# Patient Record
Sex: Female | Born: 1965 | Race: White | Hispanic: No | Marital: Single | Smoking: Former smoker
Health system: Southern US, Community
[De-identification: ages and names within clinical notes are randomized; demographics above are authoritative.]

## PROBLEM LIST (undated history)

## (undated) DIAGNOSIS — F419 Anxiety disorder, unspecified: Secondary | ICD-10-CM

## (undated) DIAGNOSIS — T8131XA Disruption of external operation (surgical) wound, not elsewhere classified, initial encounter: Secondary | ICD-10-CM

## (undated) DIAGNOSIS — T81328A Disruption or dehiscence of closure of other specified internal operation (surgical) wound, initial encounter: Secondary | ICD-10-CM

## (undated) DIAGNOSIS — C539 Malignant neoplasm of cervix uteri, unspecified: Secondary | ICD-10-CM

## (undated) HISTORY — DX: Anxiety disorder, unspecified: F41.9

## (undated) HISTORY — DX: Disruption of external operation (surgical) wound, not elsewhere classified, initial encounter: T81.31XA

## (undated) HISTORY — PX: REPAIR VAGINAL CUFF: SHX6067

## (undated) HISTORY — PX: ABDOMINAL HYSTERECTOMY: SHX81

## (undated) HISTORY — DX: Disruption or dehiscence of closure of other specified internal operation (surgical) wound, initial encounter: T81.328A

## (undated) HISTORY — DX: Malignant neoplasm of cervix uteri, unspecified: C53.9

---

## 2014-11-09 ENCOUNTER — Encounter: Payer: Self-pay | Admitting: *Deleted

## 2014-11-09 ENCOUNTER — Emergency Department
Admission: EM | Admit: 2014-11-09 | Discharge: 2014-11-09 | Disposition: A | Discharge status: Home / Self Care | Payer: 59 | Attending: Emergency Medicine | Admitting: Emergency Medicine

## 2014-11-09 ENCOUNTER — Emergency Department: Payer: 59

## 2014-11-09 DIAGNOSIS — Z720 Tobacco use: Secondary | ICD-10-CM | POA: Diagnosis not present

## 2014-11-09 DIAGNOSIS — N888 Other specified noninflammatory disorders of cervix uteri: Secondary | ICD-10-CM | POA: Diagnosis not present

## 2014-11-09 DIAGNOSIS — N939 Abnormal uterine and vaginal bleeding, unspecified: Secondary | ICD-10-CM | POA: Insufficient documentation

## 2014-11-09 LAB — CBC WITH DIFFERENTIAL/PLATELET
Basophils Absolute: 0.1 10*3/uL (ref 0–0.1)
Basophils Relative: 1 %
EOS ABS: 0.1 10*3/uL (ref 0–0.7)
EOS PCT: 1 %
HCT: 42.4 % (ref 35.0–47.0)
Hemoglobin: 14.1 g/dL (ref 12.0–16.0)
Lymphocytes Relative: 18 %
Lymphs Abs: 1.9 10*3/uL (ref 1.0–3.6)
MCH: 30.8 pg (ref 26.0–34.0)
MCHC: 33.4 g/dL (ref 32.0–36.0)
MCV: 92.4 fL (ref 80.0–100.0)
Monocytes Absolute: 0.7 10*3/uL (ref 0.2–0.9)
Monocytes Relative: 6 %
Neutro Abs: 8.1 10*3/uL — ABNORMAL HIGH (ref 1.4–6.5)
Neutrophils Relative %: 74 %
Platelets: 290 10*3/uL (ref 150–440)
RBC: 4.59 MIL/uL (ref 3.80–5.20)
RDW: 13.2 % (ref 11.5–14.5)
WBC: 11 10*3/uL (ref 3.6–11.0)

## 2014-11-09 LAB — BASIC METABOLIC PANEL
Anion gap: 9 (ref 5–15)
BUN: 29 mg/dL — ABNORMAL HIGH (ref 6–20)
CALCIUM: 10.2 mg/dL (ref 8.9–10.3)
CO2: 28 mmol/L (ref 22–32)
Chloride: 104 mmol/L (ref 101–111)
Creatinine, Ser: 0.84 mg/dL (ref 0.44–1.00)
GLUCOSE: 161 mg/dL — AB (ref 65–99)
Potassium: 3.7 mmol/L (ref 3.5–5.1)
SODIUM: 141 mmol/L (ref 135–145)

## 2014-11-09 LAB — WET PREP, GENITAL
Clue Cells Wet Prep HPF POC: NONE SEEN
TRICH WET PREP: NONE SEEN
Yeast Wet Prep HPF POC: NONE SEEN

## 2014-11-09 LAB — CHLAMYDIA/NGC RT PCR (ARMC ONLY)
Chlamydia Tr: NOT DETECTED
N gonorrhoeae: NOT DETECTED

## 2014-11-09 MED ORDER — LORAZEPAM 1 MG PO TABS
1.0000 mg | ORAL_TABLET | Freq: Once | ORAL | Status: AC
  Administered 2014-11-09: 1 mg via ORAL

## 2014-11-09 MED ORDER — LORAZEPAM 1 MG PO TABS
ORAL_TABLET | ORAL | Status: AC
  Filled 2014-11-09: qty 1

## 2014-11-09 NOTE — ED Notes (Addendum)
Pt has vag bleeding for several weeks. No abd pain.  No dysuria.  No back pain.  Seen at the urgent care today and sent to er for eval.

## 2014-11-09 NOTE — ED Provider Notes (Signed)
[facility] Emergency Department Provider Note  ____________________________________________  Time seen: Approximately 5:30PM  I have reviewed the triage vital signs and the nursing notes.   HISTORY  Chief Complaint Vaginal Bleeding    HPI Deanna Haynes is a 49 y.o. female without any medical history who presents today with several weeks of vaginal bleeding. She says that she feels like there is a sore inside her vagina that is been bleeding intermittently. She says that after she wipes she has a small amount of blood and then the bleeding stops. She says that after several weeks she wanted to get it evaluated so went to urgent care today and was sent directly to the emergency department because of postmenopausal vaginal bleeding. She said that she has not had a stroke. In 10 years. She also denies any history of sexually transmitted diseases. Says is not currently sexually active.Denies any pain associated with the lesion. Patient does not have an OB/GYN and possible last Pap smear in her teens.   No past medical history on file.  There are no active problems to display for this patient.   No past surgical history on file.  No current outpatient prescriptions on file.  Allergies Codeine  No family history on file.  Social History History  Substance Use Topics  . Smoking status: Current Every Day Smoker  . Smokeless tobacco: Not on file  . Alcohol Use: No    Review of Systems Constitutional: No fever/chills Eyes: No visual changes. ENT: No sore throat. Cardiovascular: Denies chest pain. Respiratory: Denies shortness of breath. Gastrointestinal: No abdominal pain.  No nausea, no vomiting.  No diarrhea.  No constipation. Genitourinary: Negative for dysuria. Musculoskeletal: Negative for back pain. Skin: Negative for rash. Neurological: Negative for headaches, focal weakness or numbness.  10-point ROS otherwise  negative.  ____________________________________________   PHYSICAL EXAM:  VITAL SIGNS: ED Triage Vitals  Enc Vitals Group     BP 11/09/14 1642 151/92 mmHg     Pulse Rate 11/09/14 1642 84     Resp 11/09/14 1642 20     Temp 11/09/14 1642 98.1 F (36.7 C)     Temp Source 11/09/14 1642 Oral     SpO2 11/09/14 1642 99 %     Weight 11/09/14 1642 130 lb (58.968 kg)     Height 11/09/14 1642 5\' 5"  (1.651 m)     Head Cir --      Peak Flow --      Pain Score --      Pain Loc --      Pain Edu? --      Excl. in [place]? --     Constitutional: Alert and oriented. Well appearing and in no acute distress. Eyes: Conjunctivae are normal. PERRL. EOMI. Head: Atraumatic. Nose: No congestion/rhinnorhea. Mouth/Throat: Mucous membranes are moist.  Oropharynx non-erythematous. Neck: No stridor.   Cardiovascular: Normal rate, regular rhythm. Grossly normal heart sounds.  Good peripheral circulation. Respiratory: Normal respiratory effort.  No retractions. Lungs CTAB. Gastrointestinal: Soft and nontender. No distention. No abdominal bruits. No CVA tenderness. Genitourinary: Normal external exam. Speculum exam with bright red blood present in the vault. No pooling of blood. Cervix has a irregular and wartlike appearance with mild oozing of blood. No CMT on bimanual exam. No uterine or adnexal masses on bimanual exam. Musculoskeletal: No lower extremity tenderness nor edema.  No joint effusions. Neurologic:  Normal speech and language. No gross focal neurologic deficits are appreciated. No gait instability. Skin:  Skin is  warm, dry and intact. No rash noted. Psychiatric: Mood and affect are normal. Speech and behavior are normal.  ____________________________________________   LABS (all labs ordered are listed, but only abnormal results are displayed)  Labs Reviewed  WET PREP, GENITAL - Abnormal; Notable for the following:    WBC, Wet Prep HPF POC FEW (*)    All other components within normal limits   CBC WITH DIFFERENTIAL/PLATELET - Abnormal; Notable for the following:    Neutro Abs 8.1 (*)    All other components within normal limits  BASIC METABOLIC PANEL - Abnormal; Notable for the following:    Glucose, Bld 161 (*)    BUN 29 (*)    All other components within normal limits  CHLAMYDIA/NGC RT PCR ([facility] ONLY)   ____________________________________________  EKG   ____________________________________________  RADIOLOGY  4 cm cervical mass noted on ultrasound. ____________________________________________   PROCEDURES    ____________________________________________   INITIAL IMPRESSION / ASSESSMENT AND PLAN / ED COURSE  Pertinent labs & imaging results that were available during my care of the patient were reviewed by me and considered in my medical decision making (see chart for details).  Discussed the case with Dr. Kenton Kingfisher from [facility] side OB/GYN. Patient had requested [place] side for follow-up. Says to call the morning to schedule an urgent ER follow-up.  ----------------------------------------- 8:12 PM on 11/09/2014 -----------------------------------------  Patient friends updated about findings on the ultrasound. Patient became tearful and noticeably upset. Given by mouth Ativan and now more calm. Patient to follow-up with [facility] side OB/GYN. To call tomorrow for urgent follow-up. Patient denies any suicidal ideation. Will be with friends overnight tonight and will be able to keep an eye on her and make sure that she is calm. Cervical mass is source of bleeding. ____________________________________________   FINAL CLINICAL IMPRESSION(S) / ED DIAGNOSES  Acute cervical mass. Initial visit.    Orbie Pyo, MD 11/09/14 2017

## 2014-11-09 NOTE — ED Notes (Signed)
Pt arrives with complaints of vaginal bleeding, pt states she has been through menopause but for the past few weeks has had vaginal bleeding increasing in frequency, pt states "it feels like there is a sore down there"

## 2014-11-12 ENCOUNTER — Other Ambulatory Visit: Payer: Self-pay | Admitting: Obstetrics and Gynecology

## 2014-11-12 DIAGNOSIS — C539 Malignant neoplasm of cervix uteri, unspecified: Secondary | ICD-10-CM

## 2014-11-15 ENCOUNTER — Ambulatory Visit
Admission: RE | Admit: 2014-11-15 | Discharge: 2014-11-15 | Disposition: A | Discharge status: Home / Self Care | Payer: 59 | Source: Ambulatory Visit | Attending: Obstetrics and Gynecology | Admitting: Obstetrics and Gynecology

## 2014-11-15 DIAGNOSIS — E041 Nontoxic single thyroid nodule: Secondary | ICD-10-CM | POA: Diagnosis not present

## 2014-11-15 DIAGNOSIS — C539 Malignant neoplasm of cervix uteri, unspecified: Secondary | ICD-10-CM | POA: Diagnosis not present

## 2014-11-15 LAB — GLUCOSE, CAPILLARY: Glucose-Capillary: 94 mg/dL (ref 65–99)

## 2014-11-15 MED ORDER — FLUDEOXYGLUCOSE F - 18 (FDG) INJECTION
12.6700 | Freq: Once | INTRAVENOUS | Status: DC | PRN
  Administered 2014-11-15: 12.67 via INTRAVENOUS
  Filled 2014-11-15: qty 12.67

## 2014-11-16 ENCOUNTER — Ambulatory Visit: Payer: Commercial Managed Care - PPO

## 2014-11-17 ENCOUNTER — Inpatient Hospital Stay: Payer: Commercial Managed Care - PPO | Attending: Obstetrics and Gynecology | Admitting: Obstetrics and Gynecology

## 2014-11-17 VITALS — BP 130/81 | HR 81 | Temp 99.1°F | Resp 20 | Ht 65.0 in | Wt 138.2 lb

## 2014-11-17 DIAGNOSIS — C539 Malignant neoplasm of cervix uteri, unspecified: Secondary | ICD-10-CM

## 2014-11-17 NOTE — Patient Instructions (Signed)
Cervical Cancer The cervix is the opening and bottom part of the uterus between the vagina and the uterus. Cervical cancer is a fairly common cancer. It occurs most often in women between the ages of 40 years and 55 years. Cells of the cervix act very much like skin cells. These cells are exposed to toxins, viruses, and bacteria that may cause abnormal changes.  There are two kinds of cancers of the cervix:   Squamous cell carcinoma. This type of cancer starts in the flat or scale-like cells that line the cervix. Squamous cell carcinoma can develop from a sexually transmitted infection caused by the human papillomavirus (HPV).  Adenocarcinoma. This type of cervical cancer starts in glandular cells that line the cervix. RISK FACTORS The risk of getting cancer of the cervix is related to your lifestyle, sexual history, health, and immune system. Risks for cervical cancer include:   Having a sexually transmitted viral infection. These include:  Chlamydia.   Herpes.   HPV.  Becoming sexually active before age 18 years.  Having more than one sexual partner or having sex with someone who has more than one sexual partner.  Not using condoms with sexual partners.  Having had cancer of the vagina or vulva.  Having a sexual partner who has or had cancer of the penis or who has had a sexual partner with abnormal cervical cells (dysplasia) or cervical cancer.  Using oral contraceptives (also called birth control pills).  Smoking.   Having a weakened immune system. For example, human immunodeficiency virus (HIV) or other immune deficiency disorders.  Being the daughter of a woman who took diethylstilbestrol (DES) during pregnancy.  Having a sister or mother who has had cancer of the cervix.  Being African American, Hispanic, Asian, or a woman from the [place].  A history of dysplasia of the cervix. SIGNS AND SYMPTOMS  Symptoms are usually not present in the early stages of  cervical cancer. Once the cancer invades the cervix and surrounding tissues, the woman may have:   Abnormal vaginal bleeding or menstrual bleeding that is longer or heavier than usual.  Bleeding after intercourse, douching, or a Pap test.  Vaginal bleeding following menopause.  Abnormal vaginal discharge.  Pelvic discomfort or pain.  An abnormal Pap test.  Pain during sexual intercourse. Symptoms of more advanced cervical cancer may include:   Loss of appetite or weight loss.  Tiredness (fatigue).  Back and leg pain.  Inability to control urination or bowel movements. DIAGNOSIS  A pelvic exam and Pap test are done to diagnose the condition. If abnormalities are found during the exam or Pap test, the Pap test may be repeated in 3 months, or your health care provider may do additional tests or procedures, such as:   A colposcopy. This is a procedure that uses a special microscope that allows the health care provider to magnify and closely examine the cells of the cervix, vagina, and vulva.  Cervical biopsies. This is a procedure where small tissue samples are taken from the cervix to be examined under a microscope by a specialist.   A cone biopsy. This is a procedure to test for or remove cancerous tissue. Other tests may be needed, including:   Cystoscopy.   Proctoscopy or sigmoidoscopy.  Ultrasound.   CT scan.   MRI.   Laparoscopy.  There are different stages of cervical cancer:   Stage 0, carcinoma in situ (CIS)--This first stage of cancer is the last and most serious stage of   dysplasia.  Stage I--This means the tumor is in the uterus and cervix only.  Stage II--This means the tumor has spread to the upper vagina. The cancer has spread beyond the uterus but not to the pelvic walls or lower third of the vagina.  Stage III--This means the tumor has invaded the side wall of the pelvis and the lower third of the vagina. If the tumor blocks the tubes that  carry urine to the bladder (ureters), it may cause urine to back up and the kidneys to swell (hydronephrosis).  Stage IV--This means the tumor has spread to the rectum or bladder. In the later part of this stage, it has also spread to distant organs, like the lungs. TREATMENT  Treatment options can include:   Cone biopsy to remove the cancerous tissue.   Removal of the entire uterus and cervix.   Removal of the uterus, cervix, upper vagina, lymph nodes, and surrounding tissue (modified radical hysterectomy). The ovaries may be left in place or removed.  Medicines to treat cancer.   A combination of surgery, radiation, and chemotherapy.   Biological response modifiers. These are substances that help strengthen your immune system's fight against cancer or infection. They may be used in combination with chemotherapy. HOME CARE INSTRUCTIONS   Get a gynecology exam and Pap test once every year or as directed by your health care provider.   Get the HPV vaccine.   Do not smoke.  Do not have sexual intercourse until your health care provider says it is okay.  Use a condom every time you have sex. SEEK MEDICAL CARE IF:   You have increased pelvic pain or pressure.   Your are becoming increasingly tired.   You have increased leg or back pain.   You have a fever.  You have abnormal bleeding or discharge.  You lose weight. SEEK IMMEDIATE MEDICAL CARE IF:   You cannot urinate.  You have blood in your urine.   You have blood or pressure with a bowel movement.   You develop severe back, stomach, or pelvic pain. Document Released: 03/19/2005 Document Revised: 03/24/2013 Document Reviewed: 09/10/2012 [facility] Patient Information 2015 [place], [place]. This information is not intended to replace advice given to you by your health care provider. Make sure you discuss any questions you have with your health care provider.  Risks of surgery. These include infection,  anesthesia, bleeding, transfusion, wound separation, medical issues (blood clots, stroke, heart attack, fluid in the lungs, pneumonia, abnormal heart rhythm, death), injury to nearby organs (bladder, bowel, ureter, blood vessels, nerve), possible exploratory surgery with larger incision, lymphedema (leg swelling), lymphocyst, allergic reaction, bladder dysfunction (possible need for intermittent catheter or permanent catheter), sexual issues due to shortened vagina, persistent scar tissue and pain.   Follow up appt at [facility] 11/25/2014.  Surgery 11/26/2014 robotic radical hysterectomy (removal of uterus, cervix, tubes, and ovaries), sentinel lymph node injection and node biopsy with pelvic and para-aortic lymph node dissection.
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
## 2014-11-23 ENCOUNTER — Telehealth: Payer: Self-pay | Admitting: *Deleted

## 2014-11-23 NOTE — Telephone Encounter (Signed)
Ms. Deanna Haynes dob 10-14-1965 states that she is overwhelmed and emotional over her diagnosis.  She states that Dr. Leafy Ro started her on Xanax 1 mg at bedtime.  She wants something stronger for her anxiety. Pt requesting to talk to someone about her feelings. Pt states "I am so fearful of being put to sleep and the nurses and doctors waking me up telling me that my cancer is not curable and they could not take the all the cancer away." Theureputic listening performed with patient and reassurance provided.  Msg sent to Dr. Theora Gianotti for md recommendations.  Pt had questions about preop. Is this a phone interview only for Thursday. Patient wants to clarify.  Pt also requesting disability papers to be completed and a note written asap for work that she is having the surgery performed on Friday by Dr. Theora Gianotti at [facility]. The note needs to state an estimated return to work date after the surgery.  RN spoke with Carl Best at [facility]. The patient can bring her disability paperwork to this appointment and she can receive a note at that time for her surgery. Per Baldo Ash, the patienet needs to keep her appointments at [facility] as scheduled. Pt states that she doesn't have any appointments at [facility] and does not know the time of these appointments.  I asked Baldo Ash to call the patient directly to clarify the patient's appointment time.  I also provided the patient with the number to call at [facility] for further pre-op/postoperative questions or concerns.  Nurse Triage Line Mon-Fri 8am to 5pm [phone]; [place] Mon-Fri 8am to 5pm [phone]; For appointments call the scheduling Hub [phone].  This information was also relayed to the patient's friend Deanna Haynes, who was at the residence per the patient's request. Patient became tearful when discussing her care. She asked her friend Deanna Haynes to take down the phone numbers and information to contact [facility].

## 2014-11-23 NOTE — Telephone Encounter (Signed)
I spoke with Toma Copier, coordinator for clinical social worker, who is covering for Judson Roch in her absence.  Baxter Flattery will have one of her colleagues reach out to the patient. Hand off of care performed with CSW.  Dr. Theora Gianotti will coordinate a clinical psychology team at [facility] to reach out to the patient.

## 2014-11-23 NOTE — Telephone Encounter (Signed)
Spoke with Dr. Theora Gianotti. No further medication recommendations at this time for patient's anxiety.  Md states, "Do you all have psychologist that can reach out and contact her. We have a psychology team at [facility]. If you don't, I can get her connected on Friday."  Until a referral can be initiated, I can reach out to Darden Dates at [facility] clinical social worker as patient is now requesting to talk to someone about her feelings.

## 2014-11-24 DIAGNOSIS — C539 Malignant neoplasm of cervix uteri, unspecified: Secondary | ICD-10-CM | POA: Insufficient documentation

## 2014-11-25 ENCOUNTER — Ambulatory Visit: Payer: Self-pay | Admitting: Family Medicine

## 2014-11-25 ENCOUNTER — Encounter: Payer: Self-pay | Admitting: *Deleted

## 2014-11-25 DIAGNOSIS — E079 Disorder of thyroid, unspecified: Secondary | ICD-10-CM | POA: Insufficient documentation

## 2014-11-25 NOTE — Progress Notes (Signed)
[place] Clinical Social Work  Clinical Social Work was referred by Renita Papa, RN, for assessment of psychosocial needs. RN reported patient expressed anxiety surrounding upcoming surgery.  Clinical Social Worker contacted patient by phone to offer support and assess for needs.  Ms. Mannina shared she is "scared" to undergo surgery, specifically to be put under anesthesia.  She reported she has "never been sick like this before".  CSW validated patient's feelings related to fear of the unknown and provided brief emotional support.  The patient feels her childhood history of being fearful of medical procedures may also be contributing to her anxiety.  CSW and patient discussed short-term plan to cope with anxiety: patient plans to stay with her best friend this evening and take anti-anxiety medication as prescribed.  CSW strongly recommended patient utilize psychology services available to patient while inpatient at [facility].    CSW will follow up with patient after surgery- Patient agreed she could benefit from psychotherapy, but was hesitant to travel to [facility] often.  Referral to local counseling option in [place] may be suitable outpatient option following surgery.  CSW also provided information on [facility] [place].      Polo Riley, MSW, LCSW, OSW-C Clinical Social Worker [facility] [phone]

## 2014-11-26 HISTORY — PX: RADICAL HYSTERECTOMY: SHX2283

## 2014-11-30 ENCOUNTER — Telehealth: Payer: Self-pay | Admitting: *Deleted

## 2014-11-30 DIAGNOSIS — Z960 Presence of urogenital implants: Principal | ICD-10-CM

## 2014-11-30 DIAGNOSIS — C539 Malignant neoplasm of cervix uteri, unspecified: Secondary | ICD-10-CM

## 2014-11-30 DIAGNOSIS — Z978 Presence of other specified devices: Secondary | ICD-10-CM

## 2014-11-30 NOTE — Telephone Encounter (Signed)
Needs appointment for void per Dr. Theora Gianotti. Spoke with patient. She will come on Friday morning at 830am for this the void trial.

## 2014-12-01 ENCOUNTER — Telehealth: Payer: Self-pay | Admitting: *Deleted

## 2014-12-01 NOTE — Telephone Encounter (Signed)
Has had insomnia for a whikle and was ordered Xanax by Dr Leafy Ro, Before surgery, she states Dr Theora Gianotti told her she could take an extra 1/2 tab if needed, now she is out of Xanax and cannot get refill before 9/7. She states she cannot sleep at night and needs something. Reports she is not having any pain and is not using Oxycodone or even Ibuprofen because she has no pain, but needs something to help her sleep. She states she is unable to get up and ambulate at night like she normally does. Asking Dr Theora Gianotti for med and has not contacted Dr Leafy Ro regarding this

## 2014-12-02 ENCOUNTER — Encounter: Payer: Self-pay | Admitting: *Deleted

## 2014-12-02 NOTE — Telephone Encounter (Signed)
Patient called back and was in tears stating that she needs some help, she is unable to get up and around and has no family and needs someone to speak with. I contacted Toma Copier MSW for the patient who will contact Loren the SW who saw the patient prior to surgery and they will call patient ASAP

## 2014-12-02 NOTE — Progress Notes (Signed)
[place] [facility]  Clinical Social Work was referred by [facility] triage RN for assessment of psychosocial needs- CSW is familiar with patient from contact prior to surgery.  Clinical Social Worker contacted patient by phone to offer support and assess for needs.  Deanna Haynes shared her surgery "went great" and she is recovering well.  She reported her anxiety and difficulty sleeping has not resolved and she is having difficulty coping with her anxiety.  Patient received coping strategies and brief emotional counseling from counselor at [facility] while inpatient.  CSW dicussed need for counseling support locally- referred patient to [facility] in [place], [place].  CSW also recommended patient obtain primary care physician or psychiatrist to manage anxiety medications long term.  Patient has requested refill on anxiety medication and medication to address difficulty sleeping.   CSW discussed case with Jarrett Soho, triage RN.  RN communicated she has put in request to MD and is awaiting response. CSW called patient and notified patient they are awaiting response from MD.  Patient shared she goes to [facility] tomorrow for follow up visit and can follow up on this request with [facility] directly.   Deanna Haynes, MSW, LCSW, OSW-C Clinical Social Worker [facility] [phone]

## 2014-12-03 ENCOUNTER — Encounter: Payer: Self-pay | Admitting: *Deleted

## 2014-12-03 ENCOUNTER — Other Ambulatory Visit: Payer: Self-pay | Admitting: *Deleted

## 2014-12-03 DIAGNOSIS — Z978 Presence of other specified devices: Secondary | ICD-10-CM

## 2014-12-03 DIAGNOSIS — F419 Anxiety disorder, unspecified: Secondary | ICD-10-CM | POA: Insufficient documentation

## 2014-12-03 DIAGNOSIS — C539 Malignant neoplasm of cervix uteri, unspecified: Secondary | ICD-10-CM

## 2014-12-03 DIAGNOSIS — Z960 Presence of urogenital implants: Principal | ICD-10-CM

## 2014-12-03 NOTE — Progress Notes (Signed)
Patient came to clinic today for removal of indwelling foley catheter, which was placed at [place] at the time of her hysterectomy.  The patient was placed in lithotomy position. 19ml of NS was removed from the foley cath bulb. The foley catheter was successfully removed without difficulty. She voided 100 mls after removal of foley catheter without difficulty. She was discharged from the clinic and highly encouraged to drink 64 ounces of water a day and also drink cranberry juice to make the urine slightly acetic and to prevent a UTI. She understands to call [facility] Triage if fever, chills or inability to void occurs.  Patient and her caregivers were verbally instructed. She was discharged from the clinic without further complaints.  Patient states that she was told at [facility] discharge to take 1.5 tablets of xanax per day. She states that this medication was increased at [facility] discharged from 1mg  to 1.5. She has been out for 3 days and has been having insomonia and has not heard back from Dr. Theora Gianotti on this RF request. I explained to patient that I would followup on her request.   After the patient was discharged RN received message back from MD regarding pt's RF request for xanax 1 mg. MD states that she never told the patient to increase the xanax dose per day; however she is happy to refill a 1 time RF. Per md-patient needs a referral to clinical psychology at [facility].  I contacted Ms. Shakespeare and explained that md never educated patient to increase dose of Xanax. Patient replied, "i guess someone on the team did perhaps it was the anaesthesiologist. I don't know who it was, but that I was what I heard." Educated patient to take the 1mg  of Xanax every day as initially prescribed. This is only a 1 time RF.  Patient states that a referral has already been initiated to [facility] psychology in [place]. She has not heard back yet when this appointment will be.  She states that she initiated this appointment  herself.  Patient states that she has been urinating easily since being discharged today from the clinic and has voided about 5 times this morning.

## 2014-12-08 ENCOUNTER — Other Ambulatory Visit: Payer: Self-pay | Admitting: *Deleted

## 2014-12-08 DIAGNOSIS — C539 Malignant neoplasm of cervix uteri, unspecified: Secondary | ICD-10-CM

## 2014-12-10 ENCOUNTER — Telehealth: Payer: Self-pay | Admitting: *Deleted

## 2014-12-10 NOTE — Telephone Encounter (Signed)
Patient called. Did not understand why she had to meet with Dr. Oliva Bustard.  Discussed in length the rationale for this appointment. She is to meet with the radiation oncologist and oncologist to determine treatment risks and benefits.  Patient states that if she need any further treatment she only wants radiation. "I absolutely refuse to take chemotherapy."  Questions were answered to the patient's satisfaction.

## 2014-12-13 ENCOUNTER — Telehealth: Payer: Self-pay | Admitting: *Deleted

## 2014-12-13 NOTE — Telephone Encounter (Signed)
Patient complains of urinary incontinence. Pt states "I want to be put on medication asap for my post op urinary incontinence." I educated patient that urinary incontinence is not uncommon especially after a hysterectomy and a foley catheter use. I encouraged the patient to perform Kegel exercises and void more frequently throughout the day. She will let me know how these interventions work for her. I will let Dr. Theora Gianotti know that she is having urinary incontinence; for now, the patient will try RN recommendations.

## 2014-12-16 ENCOUNTER — Encounter: Payer: Self-pay | Admitting: Oncology

## 2014-12-16 ENCOUNTER — Encounter: Payer: Self-pay | Admitting: Radiation Oncology

## 2014-12-16 ENCOUNTER — Ambulatory Visit
Admission: RE | Admit: 2014-12-16 | Discharge: 2014-12-16 | Disposition: A | Discharge status: Home / Self Care | Payer: 59 | Source: Ambulatory Visit | Attending: Radiation Oncology | Admitting: Radiation Oncology

## 2014-12-16 ENCOUNTER — Inpatient Hospital Stay: Payer: 59 | Attending: Oncology | Admitting: Oncology

## 2014-12-16 VITALS — BP 149/87 | HR 83 | Temp 98.2°F | Resp 18 | Ht 65.0 in | Wt 128.5 lb

## 2014-12-16 VITALS — BP 136/85 | HR 103 | Temp 97.5°F | Wt 128.3 lb

## 2014-12-16 DIAGNOSIS — C539 Malignant neoplasm of cervix uteri, unspecified: Secondary | ICD-10-CM | POA: Insufficient documentation

## 2014-12-16 DIAGNOSIS — Z510 Encounter for antineoplastic radiation therapy: Secondary | ICD-10-CM | POA: Insufficient documentation

## 2014-12-16 DIAGNOSIS — Z87891 Personal history of nicotine dependence: Secondary | ICD-10-CM | POA: Insufficient documentation

## 2014-12-16 DIAGNOSIS — F419 Anxiety disorder, unspecified: Secondary | ICD-10-CM

## 2014-12-16 DIAGNOSIS — F329 Major depressive disorder, single episode, unspecified: Secondary | ICD-10-CM | POA: Insufficient documentation

## 2014-12-16 DIAGNOSIS — E041 Nontoxic single thyroid nodule: Secondary | ICD-10-CM | POA: Insufficient documentation

## 2014-12-16 DIAGNOSIS — F17210 Nicotine dependence, cigarettes, uncomplicated: Secondary | ICD-10-CM

## 2014-12-16 DIAGNOSIS — Z79899 Other long term (current) drug therapy: Secondary | ICD-10-CM | POA: Insufficient documentation

## 2014-12-16 MED ORDER — ONDANSETRON HCL 4 MG PO TABS: 4.0000 mg | ORAL_TABLET | Freq: Four times a day (QID) | ORAL | Status: DC | PRN

## 2014-12-16 NOTE — Consult Note (Signed)
Except an outstanding is perfect of Radiation Oncology NEW PATIENT EVALUATION  Name: Deanna Haynes  MRN: [id number]  Date:   12/16/2014     DOB: May 17, 1965   This 49 y.o. 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  REFERRING PHYSICIAN: Bobetta Lime, MD  CHIEF COMPLAINT:  Chief Complaint  Patient presents with  . Cancer    Initial evaluatio of radiation therapy for  Cervical Cancer    DIAGNOSIS: The encounter diagnosis was Cancer determined by uterine cervix biopsy.   PREVIOUS INVESTIGATIONS:  PET CT scan reviewed Surgical pathology report reviewed Clinical notes reviewed  HPI: Patient is a 49 year old female who presented with postmenopausal bleeding was seen by her GYN oncologist noted to have an enlarged cervix with possible cervical mass measuring 4 x 3 cm. Biopsy of the cervix were consistent with adenocarcinoma. PET CT scan demonstrated hypermetabolic mass in the uterine cervix consistent with known primary tumor. She underwent a TAH/BSO at [facility] showing 19 pelvic lymph nodes with no evidence evidence of metastatic disease. The adenocarcinoma invaded 0.7 cm the 0.9 cm wall. There was also no positive lymphovascular invasion. There was 7 mm of stromal invasion. Tumor was 3.2 cm in greatest dimension. This was staged as a T1 B2. She has done well postoperatively. Based on the adenocarcinoma histology deep stromal invasion recognition has been made for postoperative adjuvant treatment including chemotherapy and radiation. She is seen today for discussion of radiation therapy. She is doing fairly well although is quite emotional. She's also having some slight urinary incontinence is wearing a pad for that. Otherwise bowel function is good no abdominal pain or bleeding per vagina.  PLANNED TREATMENT REGIMEN: Whole pelvic radiation plus intravaginal vaginal apex brachytherapy boost  PAST MEDICAL HISTORY:   has a past medical history of Anxiety and Cervical cancer.    PAST SURGICAL HISTORY: History reviewed. No pertinent past surgical history.  FAMILY HISTORY: family history is not on file.  SOCIAL HISTORY:  reports that she has been smoking Cigarettes.  She has a 20 pack-year smoking history. She has never used smokeless tobacco. She reports that she does not drink alcohol or use illicit drugs.  ALLERGIES: Codeine  MEDICATIONS:  Current Outpatient Prescriptions  Medication Sig Dispense Refill  . clonazePAM (KLONOPIN) 0.5 MG tablet   1  . nicotine (NICODERM CQ - DOSED IN MG/24 HOURS) 21 mg/24hr patch Place 1 patch onto the skin daily. Place onto skin    . sertraline (ZOLOFT) 25 MG tablet TK 1 T PO HS  1  . traZODone (DESYREL) 50 MG tablet TK 1 T PO QD  1  . ALPRAZolam (XANAX) 1 MG tablet Take 1 tablet by mouth as needed.    . ALPRAZolam (XANAX) 1 MG tablet Take by mouth.    . simethicone (MYLICON) 80 MG chewable tablet Chew 80 mg by mouth 4 (four) times daily as needed. flatulence     No current facility-administered medications for this encounter.    ECOG PERFORMANCE STATUS:  0 - Asymptomatic  REVIEW OF SYSTEMS: Except for the minor urinary incontinence and emotional lability Patient denies any weight loss, fatigue, weakness, fever, chills or night sweats. Patient denies any loss of vision, blurred vision. Patient denies any ringing  of the ears or hearing loss. No irregular heartbeat. Patient denies heart murmur or history of fainting. Patient denies any chest pain or pain radiating to her upper extremities. Patient denies any shortness of breath, difficulty breathing  at night, cough or hemoptysis. Patient denies any swelling in the lower legs. Patient denies any nausea vomiting, vomiting of blood, or coffee ground material in the vomitus. Patient denies any stomach pain. Patient states has had normal bowel movements no significant constipation or diarrhea. Patient denies any dysuria,  hematuria or significant nocturia. Patient denies any problems walking, swelling in the joints or loss of balance. Patient denies any skin changes, loss of hair or loss of weight. Patient denies any excessive worrying or anxiety or significant depression. Patient denies any problems with insomnia. Patient denies excessive thirst, polyuria, polydipsia. Patient denies any swollen glands, patient denies easy bruising or easy bleeding. Patient denies any recent infections, allergies or URI. Patient "s visual fields have not changed significantly in recent time.    PHYSICAL EXAM: BP 136/85 mmHg  Pulse 103  Temp(Src) 97.5 F (36.4 C)  Wt 128 lb 4.9 oz (58.2 kg) On speculum examination suture material is present in the vaginal apex a low this incision appears well-healed. Bimanual examination shows no evidence of parametrial mass or nodularity. Rectal exam is unremarkable. Well-developed well-nourished patient in NAD. HEENT reveals PERLA, EOMI, discs not visualized.  Oral cavity is clear. No oral mucosal lesions are identified. Neck is clear without evidence of cervical or supraclavicular adenopathy. Lungs are clear to A&P. Cardiac examination is essentially unremarkable with regular rate and rhythm without murmur rub or thrill. Abdomen is benign with no organomegaly or masses noted. Motor sensory and DTR levels are equal and symmetric in the upper and lower extremities. Cranial nerves II through XII are grossly intact. Proprioception is intact. No peripheral adenopathy or edema is identified. No motor or sensory levels are noted. Crude visual fields are within normal range.   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 GOG takes the pelvis to 5000 cGy which I found is fairly toxic with chemotherapy. In my training personal experience I abuse 4500 in 5 weeks to her whole pelvis boosting the vaginal apex where approximate 30% of local regional failure occurs at the vaginal cuff. Would boost that another 1200 cGy in 3 high-dose rate remote afterloading's. Risks and benefits of treatment including diarrhea possible dysuria fatigue alteration of blood counts possible vaginal stenosis all were discussed in detail with the patient. I will provide a vaginal dilatator for the patient after completion of therapy to avoid vaginal stenosis. Patient is reluctant to have chemotherapy although I've expressed to her the need for that in a concurrent setting and she will be discussing that with medical oncology today. I have set up and ordered CT simulation next week.  I would like to take this opportunity for allowing me to participate in the care of your patient.Armstead Peaks., MD

## 2014-12-19 ENCOUNTER — Encounter: Payer: Self-pay | Admitting: Oncology

## 2014-12-19 NOTE — Progress Notes (Signed)
[place] @ [facility] [phone]  Fax:([phone]) [place] OB: 1966-03-31  MR#: [id number]  IZT#:[id number]  Patient Care Team: Bobetta Lime, MD as PCP - General (Family Medicine)  CHIEF COMPLAINT:  Chief Complaint  Patient presents with  . Cervical Cancer  . Urinary Incontinence  invasive adenocarcinoma of cervix FIGO staging T1B2 N0M0 STATUS POST BILATERAL SALPINGO-OOPHORECTOMY AND [facility].  [name] OF 2016  VISIT DIAGNOSIS:     ICD-9-CM ICD-10-CM   1. Cervical cancer 180.9 C53.9 ondansetron (ZOFRAN) 4 MG tablet      No history exists.    Oncology Flowsheet 11/09/2014  LORazepam (ATIVAN) PO 1 mg    INTERVAL HISTORY:49 year old lady who is extremely emotional and has been accompanied by a friend.  Patient was diagnosed to have carcinoma of cervix.  Patient had invasive adenocarcinoma of cervix which has been staged as stage IB.  (for detailedl information please see initial staging workup)  Patient has been evaluated today by radiation oncologist and here to discuss possibility of adding chemotherapy. Patient works at [employer]. Extremely anxious and emotional and crying. She  does not have any other significant medical illnesses. She is not married no children.  Lives by herself Chronic smoker   REVIEW OF SYSTEMS:   GENERAL:  Feels good.  Active.  No fevers, sweats or weight loss. Very emotional crying PERFORMANCE STATUS (ECOG):  0 HEENT:  No visual changes, runny nose, sore throat, mouth sores or tenderness. Lungs: No shortness of breath or cough.  No hemoptysis. Cardiac:  No chest pain, palpitations, orthopnea, or PND. GI:  No nausea, vomiting, diarrhea, constipation, melena or hematochezia. GU:  No urgency, frequency, dysuria, or hematuria. Musculoskeletal:  No back pain.  No joint pain.  No muscle tenderness. Extremities:  No pain or swelling. Skin:  No rashes or skin changes. Neuro:  No  headache, numbness or weakness, balance or coordination issues. Endocrine:  No diabetes, thyroid issues, hot flashes or night sweats. Psych:  Depressed and extremely anxious Pain:  No focal pain. Review of systems:  All other systems reviewed and found to be negative.  As per HPI. Otherwise, a complete review of systems is negatve.  PAST MEDICAL HISTORY: Past Medical History  Diagnosis Date  . Anxiety   . Cervical cancer     PAST SURGICAL HISTORY: Recent surgery for carcinoma of cervix No other surgical history  FAMILY HISTORY There is no significant family history of breast cancer, ovarian cancer, colon cancer       ADVANCED DIRECTIVES:   Patient does not have any living will or healthcare power of attorney.  Information was given .  Available resources had been discussed.  We will follow-up on subsequent appointments regarding this issue HEALTH MAINTENANCE: Social History  Substance Use Topics  . Smoking status: Current Every Day Smoker -- 1.00 packs/day for 20 years    Types: Cigarettes  . Smokeless tobacco: Never Used  . Alcohol Use: No     Colonoscopy:  PAP:  Bone density:  Lipid panel:  Allergies  Allergen Reactions  . Codeine Nausea Only    Current Outpatient Prescriptions  Medication Sig Dispense Refill  . clonazePAM (KLONOPIN) 0.5 MG tablet   1  . nicotine (NICODERM CQ - DOSED IN MG/24 HOURS) 21 mg/24hr patch Place 1 patch onto the skin daily. Place onto skin    . sertraline (ZOLOFT) 25 MG tablet TK 1 T PO HS  1  . traZODone (DESYREL)  50 MG tablet TK 1 T PO QD  1  . ondansetron (ZOFRAN) 4 MG tablet Take 1 tablet (4 mg total) by mouth every 6 (six) hours as needed for nausea or vomiting. 30 tablet 3  . simethicone (MYLICON) 80 MG chewable tablet Chew 80 mg by mouth 4 (four) times daily as needed. flatulence     No current facility-administered medications for this visit.    OBJECTIVE: PHYSICAL EXAM: GENERAL:  Well developed, well nourished, sitting  comfortably in the exam room in no acute distress. MENTAL STATUS:  Alert and oriented to person, place and time. Very emotional and crying t.  RESPIRATORY:  Clear to auscultation without rales, wheezes or rhonchi. CARDIOVASCULAR:  Regular rate and rhythm without murmur, rub or gallop.  ABDOMEN:  Soft, non-tender, with active bowel sounds, and no hepatosplenomegaly.  No masses. BACK:  No CVA tenderness.  No tenderness on percussion of the back or rib cage. SKIN:  No rashes, ulcers or lesions. EXTREMITIES: No edema, no skin discoloration or tenderness.  No palpable cords. LYMPH NODES: No palpable cervical, supraclavicular, axillary or inguinal adenopathy  NEUROLOGICAL: Unremarkable. PSYCH:  Appropriate.  Filed Vitals:   12/16/14 1536  BP: 149/87  Pulse: 83  Temp: 98.2 F (36.8 C)  Resp: 18     Body mass index is 21.38 kg/(m^2).    ECOG FS:0 - Asymptomatic  LAB RESULTS:  No visits with results within 5 Day(s) from this visit. Latest known visit with results is:  Hospital Outpatient Visit on 11/15/2014  Component Date Value Ref Range Status  . Glucose-Capillary 11/15/2014 94  65 - 99 mg/dL Final     STUDIES: 1.Ultrasound of uterus IMPRESSION: Ovaries not visualized as transvaginal imaging could not be completed.  Probable 4 cm cervical mass is noted. Clinical correlation and direct visualization is recommended.   Electronically Signed  By: Marijo Conception, M.D.  On: 11/09/2014 20:03 2.Pathology report from [facility] has been reviewed 3.PET scan. IMPRESSION: Hypermetabolic mass in the uterine cervix, consistent with known primary cervical carcinoma.  No evidence of nodal or distant metastatic disease.  4 cm hypermetabolic left thyroid lobe nodule. Primary thyroid carcinoma cannot be excluded. Consider thyroid ultrasound and percutaneous needle aspiration for further evaluation. This follows ACR consensus guidelines: Managing Incidental Thyroid  Nodules Detected on Imaging: White Paper of the ACR Incidental Thyroid Findings Committee. J Am Coll Radiol 2015; 12:143-150.   Electronically Signed  By: Earle Gell M.D.  On: 11/15/2014 11:36   ASSESSMENT:  Invasive adenocarcinoma of cervix FIGO stage 1 B2. PET scan also shows hypermetabolic thyroid nodule, 4 cm.  PLAN:   Patient has been evaluated by radiation and GYN oncologist.   I discussed situation with Dr. Baruch Gouty, our radiation oncologist today Patient is going to start radiation therapy As per NCCN  criteria there might be an advantage of adding cis-platinum on a weekly basisx5 2.patient will need chemotherapy class 3.  Patient is extremely emotional and will add to increased dose of Zoloft and long-acting and times anxiety medications like Klonopin If needed there is a chance with my to get psychiatric help 4.  Do not see any need for port placement as cis-platinum is only for 5 weeks and patient does have good venous access 5.  Thyroid nodule will be evaluated by ENT surgeon  All the side effects of chemotherapy including myelosuppression, alopecia, nausea vomiting fatigue weakness.  Secondary infection, and   peripheral neuropathy .  Has been discussed in details. Informal consent has  been obtained and will be documented by nurses in the chart   Patient expressed understanding and was in agreement with this plan. She also understands that She can call clinic at any time with any questions, concerns, or complaints.       Cervical cancer   Staging form: Cervix Uteri, AJCC 7th Edition     Clinical: FIGO Stage IB2 (T1b2, N0) - Signed by Forest Gleason, MD on 12/19/2014   Forest Gleason, MD   12/19/2014 3:35 PM

## 2014-12-23 ENCOUNTER — Ambulatory Visit
Admission: RE | Admit: 2014-12-23 | Discharge: 2014-12-23 | Disposition: A | Discharge status: Home / Self Care | Payer: 59 | Source: Ambulatory Visit | Attending: Radiation Oncology | Admitting: Radiation Oncology

## 2014-12-23 DIAGNOSIS — C539 Malignant neoplasm of cervix uteri, unspecified: Secondary | ICD-10-CM | POA: Diagnosis not present

## 2014-12-23 DIAGNOSIS — Z510 Encounter for antineoplastic radiation therapy: Secondary | ICD-10-CM | POA: Diagnosis present

## 2014-12-23 DIAGNOSIS — Z87891 Personal history of nicotine dependence: Secondary | ICD-10-CM | POA: Diagnosis not present

## 2014-12-24 DIAGNOSIS — Z510 Encounter for antineoplastic radiation therapy: Secondary | ICD-10-CM | POA: Diagnosis not present

## 2014-12-24 NOTE — Patient Instructions (Signed)
Cisplatin injection  What is this medicine?  CISPLATIN (SIS pla tin) is a chemotherapy drug. It targets fast dividing cells, like cancer cells, and causes these cells to die. This medicine is used to treat many types of cancer like bladder, ovarian, and testicular cancers.  This medicine may be used for other purposes; ask your health care provider or pharmacist if you have questions.  COMMON BRAND NAME(S): Platinol, Platinol -AQ  What should I tell my health care provider before I take this medicine?  They need to know if you have any of these conditions:  -blood disorders  -hearing problems  -kidney disease  -recent or ongoing radiation therapy  -an unusual or allergic reaction to cisplatin, carboplatin, other chemotherapy, other medicines, foods, dyes, or preservatives  -pregnant or trying to get pregnant  -breast-feeding  How should I use this medicine?  This drug is given as an infusion into a vein. It is administered in a hospital or clinic by a specially trained health care professional.  Talk to your pediatrician regarding the use of this medicine in children. Special care may be needed.  Overdosage: If you think you have taken too much of this medicine contact a poison control center or emergency room at once.  NOTE: This medicine is only for you. Do not share this medicine with others.  What if I miss a dose?  It is important not to miss a dose. Call your doctor or health care professional if you are unable to keep an appointment.  What may interact with this medicine?  -dofetilide  -foscarnet  -medicines for seizures  -medicines to increase blood counts like filgrastim, pegfilgrastim, sargramostim  -probenecid  -pyridoxine used with altretamine  -rituximab  -some antibiotics like amikacin, gentamicin, neomycin, polymyxin B, streptomycin, tobramycin  -sulfinpyrazone  -vaccines  -zalcitabine  Talk to your doctor or health care professional before taking any of these  medicines:  -acetaminophen  -aspirin  -ibuprofen  -ketoprofen  -naproxen  This list may not describe all possible interactions. Give your health care provider a list of all the medicines, herbs, non-prescription drugs, or dietary supplements you use. Also tell them if you smoke, drink alcohol, or use illegal drugs. Some items may interact with your medicine.  What should I watch for while using this medicine?  Your condition will be monitored carefully while you are receiving this medicine. You will need important blood work done while you are taking this medicine.  This drug may make you feel generally unwell. This is not uncommon, as chemotherapy can affect healthy cells as well as cancer cells. Report any side effects. Continue your course of treatment even though you feel ill unless your doctor tells you to stop.  In some cases, you may be given additional medicines to help with side effects. Follow all directions for their use.  Call your doctor or health care professional for advice if you get a fever, chills or sore throat, or other symptoms of a cold or flu. Do not treat yourself. This drug decreases your body's ability to fight infections. Try to avoid being around people who are sick.  This medicine may increase your risk to bruise or bleed. Call your doctor or health care professional if you notice any unusual bleeding.  Be careful brushing and flossing your teeth or using a toothpick because you may get an infection or bleed more easily. If you have any dental work done, tell your dentist you are receiving this medicine.  Avoid taking products   that contain aspirin, acetaminophen, ibuprofen, naproxen, or ketoprofen unless instructed by your doctor. These medicines may hide a fever.  Do not become pregnant while taking this medicine. Women should inform their doctor if they wish to become pregnant or think they might be pregnant. There is a potential for serious side effects to an unborn child. Talk to  your health care professional or pharmacist for more information. Do not breast-feed an infant while taking this medicine.  Drink fluids as directed while you are taking this medicine. This will help protect your kidneys.  Call your doctor or health care professional if you get diarrhea. Do not treat yourself.  What side effects may I notice from receiving this medicine?  Side effects that you should report to your doctor or health care professional as soon as possible:  -allergic reactions like skin rash, itching or hives, swelling of the face, lips, or tongue  -signs of infection - fever or chills, cough, sore throat, pain or difficulty passing urine  -signs of decreased platelets or bleeding - bruising, pinpoint red spots on the skin, black, tarry stools, nosebleeds  -signs of decreased red blood cells - unusually weak or tired, fainting spells, lightheadedness  -breathing problems  -changes in hearing  -gout pain  -low blood counts - This drug may decrease the number of white blood cells, red blood cells and platelets. You may be at increased risk for infections and bleeding.  -nausea and vomiting  -pain, swelling, redness or irritation at the injection site  -pain, tingling, numbness in the hands or feet  -problems with balance, movement  -trouble passing urine or change in the amount of urine  Side effects that usually do not require medical attention (report to your doctor or health care professional if they continue or are bothersome):  -changes in vision  -loss of appetite  -metallic taste in the mouth or changes in taste  This list may not describe all possible side effects. Call your doctor for medical advice about side effects. You may report side effects to FDA at 1-800-FDA-1088.  Where should I keep my medicine?  This drug is given in a hospital or clinic and will not be stored at home.  NOTE: This sheet is a summary. It may not cover all possible information. If you have questions about this medicine,  talk to your doctor, pharmacist, or health care provider.   2015, Elsevier/Gold Standard. (2007-06-24 14:40:54)

## 2014-12-28 DIAGNOSIS — Z510 Encounter for antineoplastic radiation therapy: Secondary | ICD-10-CM | POA: Diagnosis not present

## 2014-12-29 ENCOUNTER — Inpatient Hospital Stay (HOSPITAL_BASED_OUTPATIENT_CLINIC_OR_DEPARTMENT_OTHER): Payer: 59 | Admitting: Obstetrics and Gynecology

## 2014-12-29 VITALS — BP 128/78 | HR 80 | Temp 98.3°F | Wt 128.3 lb

## 2014-12-29 DIAGNOSIS — F419 Anxiety disorder, unspecified: Secondary | ICD-10-CM | POA: Diagnosis not present

## 2014-12-29 DIAGNOSIS — E041 Nontoxic single thyroid nodule: Secondary | ICD-10-CM | POA: Diagnosis not present

## 2014-12-29 DIAGNOSIS — C539 Malignant neoplasm of cervix uteri, unspecified: Secondary | ICD-10-CM | POA: Diagnosis not present

## 2014-12-29 DIAGNOSIS — F329 Major depressive disorder, single episode, unspecified: Secondary | ICD-10-CM | POA: Diagnosis not present

## 2014-12-29 DIAGNOSIS — Z79899 Other long term (current) drug therapy: Secondary | ICD-10-CM | POA: Diagnosis not present

## 2014-12-29 DIAGNOSIS — Z48816 Encounter for surgical aftercare following surgery on the genitourinary system: Secondary | ICD-10-CM

## 2014-12-29 DIAGNOSIS — F17210 Nicotine dependence, cigarettes, uncomplicated: Secondary | ICD-10-CM | POA: Diagnosis not present

## 2014-12-29 NOTE — Progress Notes (Signed)
Assisted MD with pelvic exam
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
## 2014-12-29 NOTE — Progress Notes (Signed)
Patient is very anxious regarding follow up exam.  Reports that she has had several two vaginal exams this week and is reluctant to have another this week.  Reports some urinary incontinence

## 2014-12-30 ENCOUNTER — Ambulatory Visit: Payer: 59

## 2015-01-03 ENCOUNTER — Inpatient Hospital Stay: Payer: 59 | Attending: Oncology | Admitting: Oncology

## 2015-01-03 ENCOUNTER — Ambulatory Visit
Admission: RE | Admit: 2015-01-03 | Discharge: 2015-01-03 | Disposition: A | Discharge status: Home / Self Care | Payer: 59 | Source: Ambulatory Visit | Attending: Radiation Oncology | Admitting: Radiation Oncology

## 2015-01-03 ENCOUNTER — Encounter: Payer: Self-pay | Admitting: Oncology

## 2015-01-03 ENCOUNTER — Inpatient Hospital Stay: Payer: 59

## 2015-01-03 VITALS — BP 137/77 | HR 65 | Temp 95.6°F | Wt 126.5 lb

## 2015-01-03 VITALS — BP 125/71 | HR 60 | Temp 96.1°F | Resp 18

## 2015-01-03 DIAGNOSIS — E041 Nontoxic single thyroid nodule: Secondary | ICD-10-CM | POA: Diagnosis not present

## 2015-01-03 DIAGNOSIS — Z79899 Other long term (current) drug therapy: Secondary | ICD-10-CM | POA: Insufficient documentation

## 2015-01-03 DIAGNOSIS — F419 Anxiety disorder, unspecified: Secondary | ICD-10-CM | POA: Diagnosis not present

## 2015-01-03 DIAGNOSIS — F329 Major depressive disorder, single episode, unspecified: Secondary | ICD-10-CM | POA: Insufficient documentation

## 2015-01-03 DIAGNOSIS — R110 Nausea: Secondary | ICD-10-CM | POA: Insufficient documentation

## 2015-01-03 DIAGNOSIS — F17210 Nicotine dependence, cigarettes, uncomplicated: Secondary | ICD-10-CM | POA: Diagnosis not present

## 2015-01-03 DIAGNOSIS — C539 Malignant neoplasm of cervix uteri, unspecified: Secondary | ICD-10-CM | POA: Insufficient documentation

## 2015-01-03 DIAGNOSIS — Z510 Encounter for antineoplastic radiation therapy: Secondary | ICD-10-CM | POA: Diagnosis not present

## 2015-01-03 DIAGNOSIS — R197 Diarrhea, unspecified: Secondary | ICD-10-CM | POA: Insufficient documentation

## 2015-01-03 DIAGNOSIS — Z5111 Encounter for antineoplastic chemotherapy: Secondary | ICD-10-CM | POA: Diagnosis not present

## 2015-01-03 MED ORDER — SODIUM CHLORIDE 0.9 % IV SOLN
40.0000 mg/m2 | Freq: Once | INTRAVENOUS | Status: AC
  Administered 2015-01-03: 66 mg via INTRAVENOUS
  Filled 2015-01-03: qty 66

## 2015-01-03 MED ORDER — PALONOSETRON HCL INJECTION 0.25 MG/5ML
0.2500 mg | Freq: Once | INTRAVENOUS | Status: AC
  Administered 2015-01-03: 0.25 mg via INTRAVENOUS
  Filled 2015-01-03: qty 5

## 2015-01-03 MED ORDER — SERTRALINE HCL 50 MG PO TABS: 50.0000 mg | ORAL_TABLET | Freq: Every day | ORAL | Status: DC

## 2015-01-03 MED ORDER — SODIUM CHLORIDE 0.9 % IV SOLN
Freq: Once | INTRAVENOUS | Status: AC
  Administered 2015-01-03: 13:00:00 via INTRAVENOUS
  Filled 2015-01-03: qty 5

## 2015-01-03 MED ORDER — SODIUM CHLORIDE 0.9 % IV SOLN
Freq: Once | INTRAVENOUS | Status: AC
  Administered 2015-01-03: 11:00:00 via INTRAVENOUS
  Filled 2015-01-03: qty 1000

## 2015-01-03 MED ORDER — DEXTROSE-NACL 5-0.45 % IV SOLN
Freq: Once | INTRAVENOUS | Status: AC
Start: 2015-01-03 — End: 2015-01-03
  Administered 2015-01-03: 11:00:00 via INTRAVENOUS
  Filled 2015-01-03: qty 1000

## 2015-01-03 NOTE — Progress Notes (Signed)
[place] @ [facility] [phone]  Fax:([phone]) [place]: 12/03/1965  MR#: [id number]  PXT#:[id number]  Patient Care Team: Bobetta Lime, MD as PCP - General (Family Medicine)  CHIEF COMPLAINT:  Chief Complaint  Patient presents with  . 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  [name] OF 2016. Patient starting radiation and chemotherapy with cis-platinum.  VISIT DIAGNOSIS:   Carcinoma of cervix    Oncology Flowsheet 11/09/2014  LORazepam (ATIVAN) PO 1 mg    INTERVAL HISTORY:49 year old lady who is extremely emotional and has been accompanied by a friend.  Patient was diagnosed to have carcinoma of cervix.  Patient had invasive adenocarcinoma of cervix which has been staged as stage IB.  (for detailedl information please see initial staging workup)  Patient has been evaluated today by radiation oncologist and here to discuss possibility of adding chemotherapy. Patient works at [employer]. Extremely anxious and emotional and crying. She  does not have any other significant medical illnesses. She is not married no children.  Lives by herself Chronic smoker Patient is here to initiate chemotherapy.  Extremely apprehensive.  Agitated.  Starting radiation therapy from tomorrow.  REVIEW OF SYSTEMS:   GENERAL:  Feels good.  Active.  No fevers, sweats or weight loss. Very emotional crying PERFORMANCE STATUS (ECOG):  0 HEENT:  No visual changes, runny nose, sore throat, mouth sores or tenderness. Lungs: No shortness of breath or cough.  No hemoptysis. Cardiac:  No chest pain, palpitations, orthopnea, or PND. GI:  No nausea, vomiting, diarrhea, constipation, melena or hematochezia. GU:  No urgency, frequency, dysuria, or hematuria. Musculoskeletal:  No back pain.  No joint pain.  No muscle tenderness. Extremities:  No pain or  swelling. Skin:  No rashes or skin changes. Neuro:  No headache, numbness or weakness, balance or coordination issues. Endocrine:  No diabetes, thyroid issues, hot flashes or night sweats. Psych:  Depressed and extremely anxious Pain:  No focal pain. Review of systems:  All other systems reviewed and found to be negative.  As per HPI. Otherwise, a complete review of systems is negatve.  PAST MEDICAL HISTORY: Past Medical History  Diagnosis Date  . Anxiety   . Cervical cancer ([place])     PAST SURGICAL HISTORY: Recent surgery for carcinoma of cervix No other surgical history  FAMILY HISTORY There is no significant family history of breast cancer, ovarian cancer, colon cancer       ADVANCED DIRECTIVES:   Patient does not have any living will or healthcare power of attorney.  Information was given .  Available resources had been discussed.  We will follow-up on subsequent appointments regarding this issue HEALTH MAINTENANCE: Social History  Substance Use Topics  . Smoking status: Current Every Day Smoker -- 1.00 packs/day for 20 years    Types: Cigarettes  . Smokeless tobacco: Never Used  . Alcohol Use: No    el:  Allergies  Allergen Reactions  . Codeine Nausea Only    Current Outpatient Prescriptions  Medication Sig Dispense Refill  . Ascorbic Acid (VITAMIN C) 1000 MG tablet Take 1,000 mg by mouth 2 times daily at 12 noon and 4 pm.    . clonazePAM (KLONOPIN) 0.5 MG tablet   1  . Multiple Vitamins-Iron (MULTIVITAMINS WITH IRON) TABS tablet Take 1 tablet by mouth daily.    . Multiple Vitamins-Minerals (MULTIVITAMIN WITH MINERALS) tablet Take 1 tablet by  mouth daily.    . nicotine (NICODERM CQ - DOSED IN MG/24 HOURS) 21 mg/24hr patch Place 1 patch onto the skin daily. Place onto skin    . ondansetron (ZOFRAN) 4 MG tablet Take 1 tablet (4 mg total) by mouth every 6 (six) hours as needed for nausea or vomiting. 30 tablet 3  . sertraline (ZOLOFT) 25 MG tablet TK 1 T PO HS   1  . traZODone (DESYREL) 50 MG tablet TK 1 T PO QD  1  . vitamin B-12 (CYANOCOBALAMIN) 100 MCG tablet Take 100 mcg by mouth daily.     No current facility-administered medications for this visit.    OBJECTIVE: PHYSICAL EXAM: GENERAL:  Well developed, well nourished, sitting comfortably in the exam room in no acute distress. MENTAL STATUS:  Alert and oriented to person, place and time. Extremely apprehensive and agitated. t.  RESPIRATORY:  Clear to auscultation without rales, wheezes or rhonchi. CARDIOVASCULAR:  Regular rate and rhythm without murmur, rub or gallop.  ABDOMEN:  Soft, non-tender, with active bowel sounds, and no hepatosplenomegaly.  No masses. BACK:  No CVA tenderness.  No tenderness on percussion of the back or rib cage. SKIN:  No rashes, ulcers or lesions. EXTREMITIES: No edema, no skin discoloration or tenderness.  No palpable cords. LYMPH NODES: No palpable cervical, supraclavicular, axillary or inguinal adenopathy  NEUROLOGICAL: Unremarkable. PSYCH:  Appropriate.  Filed Vitals:   01/03/15 0911  BP: 137/77  Pulse: 65  Temp: 95.6 F (35.3 C)     Body mass index is 21.05 kg/(m^2).    ECOG FS:0 - Asymptomatic  LAB RESULTS:  No visits with results within 5 Day(s) from this visit. Latest known visit with results is:  Hospital Outpatient Visit on 11/15/2014  Component Date Value Ref Range Status  . Glucose-Capillary 11/15/2014 94  65 - 99 mg/dL Final   lab result   From  [facility]  WBC 9.4. Hemoglobin 14.3.  Platelet count is 12.9.   STUDIES: 1.Ultrasound of uterus IMPRESSION: Ovaries not visualized as transvaginal imaging could not be completed.  Probable 4 cm cervical mass is noted. Clinical correlation and direct visualization is recommended.   Electronically Signed  By: Marijo Conception, M.D.  On: 11/09/2014 20:03 2.Pathology report from [facility] has been reviewed 3.PET scan. IMPRESSION: Hypermetabolic mass in the uterine  cervix, consistent with known primary cervical carcinoma.  No evidence of nodal or distant metastatic disease.  4 cm hypermetabolic left thyroid lobe nodule. Primary thyroid carcinoma cannot be excluded. Consider thyroid ultrasound and percutaneous needle aspiration for further evaluation. This follows ACR consensus guidelines: Managing Incidental Thyroid Nodules Detected on Imaging: White Paper of the ACR Incidental Thyroid Findings Committee. J Am Coll Radiol 2015; 12:143-150.   Electronically Signed  By: Earle Gell M.D.  On: 11/15/2014 11:36   ASSESSMENT:  Invasive adenocarcinoma of cervix FIGO stage 1 B2. PET scan also shows hypermetabolic thyroid nodule, 4 cm.  PLAN:   Patient has been evaluated by radiation and GYN oncologist.   I discussed situation with Dr. Baruch Gouty, our radiation oncologist today Patient is going to start radiation therapy As per NCCN  criteria there might be an advantage of adding cis-platinum on a weekly basisx5 2.patient will need chemotherapy class 3.  Patient is extremely emotional and will add to increased dose of Zoloft and long-acting and times anxiety medications like Klonopin If needed there is a chance with my to get psychiatric help 4.  Do not see any need for port placement  as cis-platinum is only for 5 weeks and patient does have good venous access 5.  Thyroid nodule will be evaluated by ENT surgeon ./ again patient and number of questions.  Will start patient on a more therapy with cis-platinum. Zoloft has been increased to 50 mg daily Patient was advised to continue Klonopin Patient will get interim IV fluid on Wednesday to prevent any nausea and vomiting see has been extraordinarily apprehensive and and anxious and agitated.  And depressed  Patient expressed understanding and was in agreement with this plan. She also understands that She can call clinic at any time with any questions, concerns, or complaints.        Cervical cancer   Staging form: Cervix Uteri, AJCC 7th Edition     Clinical: FIGO Stage IB2 (T1b2, N0) - Signed by Forest Gleason, MD on 12/19/2014   Forest Gleason, MD   01/03/2015 9:53 AM

## 2015-01-03 NOTE — Progress Notes (Signed)
Patient does not have living will.  Currently trying to stop smoking.  Using nicoderm patches.  Anxious today about first treatment.

## 2015-01-04 ENCOUNTER — Ambulatory Visit
Admission: RE | Admit: 2015-01-04 | Discharge: 2015-01-04 | Disposition: A | Discharge status: Home / Self Care | Payer: 59 | Source: Ambulatory Visit | Attending: Radiation Oncology | Admitting: Radiation Oncology

## 2015-01-04 DIAGNOSIS — Z510 Encounter for antineoplastic radiation therapy: Secondary | ICD-10-CM | POA: Diagnosis not present

## 2015-01-05 ENCOUNTER — Ambulatory Visit
Admission: RE | Admit: 2015-01-05 | Discharge: 2015-01-05 | Disposition: A | Discharge status: Home / Self Care | Payer: 59 | Source: Ambulatory Visit | Attending: Radiation Oncology | Admitting: Radiation Oncology

## 2015-01-05 ENCOUNTER — Inpatient Hospital Stay: Payer: 59

## 2015-01-05 DIAGNOSIS — Z510 Encounter for antineoplastic radiation therapy: Secondary | ICD-10-CM | POA: Diagnosis not present

## 2015-01-06 ENCOUNTER — Ambulatory Visit
Admission: RE | Admit: 2015-01-06 | Discharge: 2015-01-06 | Disposition: A | Discharge status: Home / Self Care | Payer: 59 | Source: Ambulatory Visit | Attending: Radiation Oncology | Admitting: Radiation Oncology

## 2015-01-06 DIAGNOSIS — Z510 Encounter for antineoplastic radiation therapy: Secondary | ICD-10-CM | POA: Diagnosis not present

## 2015-01-07 ENCOUNTER — Ambulatory Visit
Admission: RE | Admit: 2015-01-07 | Discharge: 2015-01-07 | Disposition: A | Discharge status: Home / Self Care | Payer: 59 | Source: Ambulatory Visit | Attending: Radiation Oncology | Admitting: Radiation Oncology

## 2015-01-07 DIAGNOSIS — Z510 Encounter for antineoplastic radiation therapy: Secondary | ICD-10-CM | POA: Diagnosis not present

## 2015-01-10 ENCOUNTER — Ambulatory Visit
Admission: RE | Admit: 2015-01-10 | Discharge: 2015-01-10 | Disposition: A | Discharge status: Home / Self Care | Payer: 59 | Source: Ambulatory Visit | Attending: Radiation Oncology | Admitting: Radiation Oncology

## 2015-01-10 ENCOUNTER — Inpatient Hospital Stay: Payer: 59

## 2015-01-10 ENCOUNTER — Inpatient Hospital Stay (HOSPITAL_BASED_OUTPATIENT_CLINIC_OR_DEPARTMENT_OTHER): Payer: 59 | Admitting: Family Medicine

## 2015-01-10 ENCOUNTER — Encounter: Payer: Self-pay | Admitting: Oncology

## 2015-01-10 VITALS — BP 132/78 | HR 76 | Temp 96.8°F | Wt 126.0 lb

## 2015-01-10 DIAGNOSIS — E041 Nontoxic single thyroid nodule: Secondary | ICD-10-CM | POA: Diagnosis not present

## 2015-01-10 DIAGNOSIS — F419 Anxiety disorder, unspecified: Secondary | ICD-10-CM

## 2015-01-10 DIAGNOSIS — C539 Malignant neoplasm of cervix uteri, unspecified: Secondary | ICD-10-CM

## 2015-01-10 DIAGNOSIS — F329 Major depressive disorder, single episode, unspecified: Secondary | ICD-10-CM

## 2015-01-10 DIAGNOSIS — F17210 Nicotine dependence, cigarettes, uncomplicated: Secondary | ICD-10-CM

## 2015-01-10 DIAGNOSIS — Z510 Encounter for antineoplastic radiation therapy: Secondary | ICD-10-CM | POA: Diagnosis not present

## 2015-01-10 LAB — COMPREHENSIVE METABOLIC PANEL
ALK PHOS: 53 U/L (ref 38–126)
ALT: 14 U/L (ref 14–54)
AST: 17 U/L (ref 15–41)
Albumin: 3.6 g/dL (ref 3.5–5.0)
Anion gap: 8 (ref 5–15)
BUN: 21 mg/dL — AB (ref 6–20)
CHLORIDE: 105 mmol/L (ref 101–111)
CO2: 25 mmol/L (ref 22–32)
Calcium: 9.1 mg/dL (ref 8.9–10.3)
Creatinine, Ser: 0.49 mg/dL (ref 0.44–1.00)
GFR calc Af Amer: >60 mL/min (ref >60)
GFR calc non Af Amer: >60 mL/min (ref >60)
Glucose, Bld: 123 mg/dL — ABNORMAL HIGH (ref 65–99)
Potassium: 4.1 mmol/L (ref 3.5–5.1)
SODIUM: 138 mmol/L (ref 135–145)
TOTAL PROTEIN: 6.7 g/dL (ref 6.5–8.1)
Total Bilirubin: 0.5 mg/dL (ref 0.3–1.2)

## 2015-01-10 LAB — CBC WITH DIFFERENTIAL/PLATELET
BASOS ABS: 0 10*3/uL (ref 0–0.1)
Basophils Relative: 0 %
EOS ABS: 0.1 10*3/uL (ref 0–0.7)
EOS PCT: 1 %
HCT: 39.4 % (ref 35.0–47.0)
HEMOGLOBIN: 13.5 g/dL (ref 12.0–16.0)
LYMPHS ABS: 0.8 10*3/uL — AB (ref 1.0–3.6)
LYMPHS PCT: 9 %
MCH: 31.2 pg (ref 26.0–34.0)
MCHC: 34.2 g/dL (ref 32.0–36.0)
MCV: 91.2 fL (ref 80.0–100.0)
Monocytes Absolute: 0.6 10*3/uL (ref 0.2–0.9)
Monocytes Relative: 6 %
NEUTROS PCT: 84 %
Neutro Abs: 7.5 10*3/uL — ABNORMAL HIGH (ref 1.4–6.5)
PLATELETS: 292 10*3/uL (ref 150–440)
RBC: 4.32 MIL/uL (ref 3.80–5.20)
RDW: 12.9 % (ref 11.5–14.5)
WBC: 9 10*3/uL (ref 3.6–11.0)

## 2015-01-10 LAB — MAGNESIUM: MAGNESIUM: 1.9 mg/dL (ref 1.7–2.4)

## 2015-01-10 MED ORDER — PALONOSETRON HCL INJECTION 0.25 MG/5ML
0.2500 mg | Freq: Once | INTRAVENOUS | Status: AC
  Administered 2015-01-10: 0.25 mg via INTRAVENOUS
  Filled 2015-01-10: qty 5

## 2015-01-10 MED ORDER — HEPARIN SOD (PORK) LOCK FLUSH 100 UNIT/ML IV SOLN
500.0000 [IU] | Freq: Once | INTRAVENOUS | Status: DC | PRN
  Filled 2015-01-10: qty 5

## 2015-01-10 MED ORDER — ONDANSETRON HCL 4 MG PO TABS: 4.0000 mg | ORAL_TABLET | Freq: Four times a day (QID) | ORAL | Status: DC | PRN

## 2015-01-10 MED ORDER — SODIUM CHLORIDE 0.9 % IJ SOLN
10.0000 mL | INTRAMUSCULAR | Status: DC | PRN
  Filled 2015-01-10: qty 10

## 2015-01-10 MED ORDER — SODIUM CHLORIDE 0.9 % IV SOLN
40.0000 mg/m2 | Freq: Once | INTRAVENOUS | Status: AC
  Administered 2015-01-10: 66 mg via INTRAVENOUS
  Filled 2015-01-10: qty 66

## 2015-01-10 MED ORDER — POTASSIUM CHLORIDE 2 MEQ/ML IV SOLN
Freq: Once | INTRAVENOUS | Status: AC
  Administered 2015-01-10: 10:00:00 via INTRAVENOUS
  Filled 2015-01-10: qty 1000

## 2015-01-10 MED ORDER — SODIUM CHLORIDE 0.9 % IV SOLN
Freq: Once | INTRAVENOUS | Status: AC
  Administered 2015-01-10: 12:00:00 via INTRAVENOUS
  Filled 2015-01-10: qty 5

## 2015-01-10 NOTE — Progress Notes (Signed)
[place]  Telephone:(336) [phone]  Fax:(336) [phone]     Deanna Haynes DOB: 04/14/1965  MR#: [id number]  EKC#:[id number]  Patient Care Team: Bobetta Lime, MD as PCP - General (Family Medicine)  CHIEF COMPLAINT:  Chief Complaint  Patient presents with  . OTHER  . Abdominal Pain   Diagnosis of invasive adenocarcinoma of cervix.  INTERVAL HISTORY:  Patient is here for further follow-up and treatment consideration regarding invasive adenocarcinoma of the cervix. Patient is status post bilateral salpingo-oophorectomy and hysterectomy at [facility] in August 2016. Patient is receiving concurrent radiation with cisplatin weekly. She reports tolerating cycle 1 very well. She admits to anticipatory anxiety regarding chemotherapy treatments and concern of nausea but reports she had no nausea and has felt great. She continues to work full-time and care for her animals at home.  REVIEW OF SYSTEMS:   Review of Systems  Constitutional: Negative for fever, chills, weight loss, malaise/fatigue and diaphoresis.  HENT: Negative for congestion, ear discharge, ear pain, hearing loss, nosebleeds, sore throat and tinnitus.   Eyes: Negative for blurred vision, double vision, photophobia, pain, discharge and redness.  Respiratory: Negative for cough, hemoptysis, sputum production, shortness of breath, wheezing and stridor.   Cardiovascular: Negative for chest pain, palpitations, orthopnea, claudication, leg swelling and PND.  Gastrointestinal: Negative for heartburn, nausea, vomiting, abdominal pain, diarrhea, constipation, blood in stool and melena.  Genitourinary: Negative.   Musculoskeletal: Negative.   Skin: Negative.   Neurological: Negative for dizziness, tingling, focal weakness, seizures, weakness and headaches.  Endo/Heme/Allergies: Does not bruise/bleed easily.  Psychiatric/Behavioral: Negative for depression. The patient is not nervous/anxious and does not have insomnia.      As per HPI. Otherwise, a complete review of systems is negatve.  ONCOLOGY HISTORY:   Cervical cancer ([place])   11/17/2014 Initial Diagnosis Cervical cancer ([place])   11/17/2014 Surgery status post bilateral salpingo-oophorectomy and hysterectomy   01/03/2015 -  Chemotherapy Cisplatin with concurrent radiation   01/03/2015 -  Radiation Therapy     PAST MEDICAL HISTORY: Past Medical History  Diagnosis Date  . Anxiety   . Cervical cancer ([place])     PAST SURGICAL HISTORY: No past surgical history on file.  FAMILY HISTORY No family history on file.  GYNECOLOGIC HISTORY:  No LMP recorded. Patient is postmenopausal.     ADVANCED DIRECTIVES:    HEALTH MAINTENANCE: Social History  Substance Use Topics  . Smoking status: Current Every Day Smoker -- 1.00 packs/day for 20 years    Types: Cigarettes  . Smokeless tobacco: Never Used  . Alcohol Use: No     Colonoscopy:  PAP:  Bone density:  Lipid panel:  Allergies  Allergen Reactions  . Codeine Nausea Only    Current Outpatient Prescriptions  Medication Sig Dispense Refill  . Ascorbic Acid (VITAMIN C) 1000 MG tablet Take 1,000 mg by mouth 2 times daily at 12 noon and 4 pm.    . clonazePAM (KLONOPIN) 0.5 MG tablet   1  . Multiple Vitamins-Iron (MULTIVITAMINS WITH IRON) TABS tablet Take 1 tablet by mouth daily.    . Multiple Vitamins-Minerals (MULTIVITAMIN WITH MINERALS) tablet Take 1 tablet by mouth daily.    . nicotine (NICODERM CQ - DOSED IN MG/24 HOURS) 21 mg/24hr patch Place 1 patch onto the skin daily. Place onto skin    . ondansetron (ZOFRAN) 4 MG tablet Take 1 tablet (4 mg total) by mouth every 6 (six) hours as needed for nausea or vomiting. 30 tablet 3  . sertraline (ZOLOFT) 50  MG tablet Take 1 tablet (50 mg total) by mouth at bedtime. 30 tablet 3  . traZODone (DESYREL) 50 MG tablet TK 1 T PO QD  1  . vitamin B-12 (CYANOCOBALAMIN) 100 MCG tablet Take 100 mcg by mouth daily.    Marland Kitchen ALPRAZolam (XANAX) 1 MG tablet TK 1 T  PO Q NIGHT PRN FOR SLEEP  0   No current facility-administered medications for this visit.   Facility-Administered Medications Ordered in Other Visits  Medication Dose Route Frequency Provider Last Rate Last Dose  . CISplatin (PLATINOL) 66 mg in sodium chloride 0.9 % 250 mL chemo infusion  40 mg/m2 (Treatment Plan Actual) Intravenous Once Evlyn Kanner, NP      . dextrose 5 % and 0.45% NaCl 1,000 mL with potassium chloride 20 mEq, magnesium sulfate 12 mEq infusion   Intravenous Once Evlyn Kanner, NP      . fosaprepitant (EMEND) 150 mg, dexamethasone (DECADRON) 12 mg in sodium chloride 0.9 % 145 mL IVPB   Intravenous Once Evlyn Kanner, NP      . heparin lock flush 100 unit/mL  500 Units Intracatheter Once PRN Evlyn Kanner, NP      . palonosetron (ALOXI) injection 0.25 mg  0.25 mg Intravenous Once Evlyn Kanner, NP      . sodium chloride 0.9 % injection 10 mL  10 mL Intracatheter PRN Evlyn Kanner, NP        OBJECTIVE: BP 132/78 mmHg  Pulse 76  Temp(Src) 96.8 F (36 C) (Tympanic)  Wt 126 lb (57.153 kg)   Body mass index is 20.97 kg/(m^2).    ECOG FS:0 - Asymptomatic  General: Well-developed, well-nourished, no acute distress. Eyes: Pink conjunctiva, anicteric sclera. HEENT: Normocephalic, moist mucous membranes, clear oropharnyx. Lungs: Clear to auscultation bilaterally. Heart: Regular rate and rhythm. No rubs, murmurs, or gallops. Abdomen: Soft, nontender, nondistended. No organomegaly noted, normoactive bowel sounds. Musculoskeletal: No edema, cyanosis, or clubbing. Neuro: Alert, answering all questions appropriately. Cranial nerves grossly intact. Skin: No rashes or petechiae noted. Psych: Normal affect. Lymphatics: No cervical, clavicular LAD.   LAB RESULTS:  Appointment on 01/10/2015  Component Date Value Ref Range Status  . WBC 01/10/2015 9.0  3.6 - 11.0 K/uL Final  . RBC 01/10/2015 4.32  3.80 - 5.20 MIL/uL Final  . Hemoglobin 01/10/2015 13.5  12.0 -  16.0 g/dL Final  . HCT 01/10/2015 39.4  35.0 - 47.0 % Final  . MCV 01/10/2015 91.2  80.0 - 100.0 fL Final  . MCH 01/10/2015 31.2  26.0 - 34.0 pg Final  . MCHC 01/10/2015 34.2  32.0 - 36.0 g/dL Final  . RDW 01/10/2015 12.9  11.5 - 14.5 % Final  . Platelets 01/10/2015 292  150 - 440 K/uL Final  . Neutrophils Relative % 01/10/2015 84   Final  . Neutro Abs 01/10/2015 7.5* 1.4 - 6.5 K/uL Final  . Lymphocytes Relative 01/10/2015 9   Final  . Lymphs Abs 01/10/2015 0.8* 1.0 - 3.6 K/uL Final  . Monocytes Relative 01/10/2015 6   Final  . Monocytes Absolute 01/10/2015 0.6  0.2 - 0.9 K/uL Final  . Eosinophils Relative 01/10/2015 1   Final  . Eosinophils Absolute 01/10/2015 0.1  0 - 0.7 K/uL Final  . Basophils Relative 01/10/2015 0   Final  . Basophils Absolute 01/10/2015 0.0  0 - 0.1 K/uL Final  . Sodium 01/10/2015 138  135 - 145 mmol/L Final  . Potassium 01/10/2015 4.1  3.5 - 5.1 mmol/L Final  .  Chloride 01/10/2015 105  101 - 111 mmol/L Final  . CO2 01/10/2015 25  22 - 32 mmol/L Final  . Glucose, Bld 01/10/2015 123* 65 - 99 mg/dL Final  . BUN 01/10/2015 21* 6 - 20 mg/dL Final  . Creatinine, Ser 01/10/2015 0.49  0.44 - 1.00 mg/dL Final  . Calcium 01/10/2015 9.1  8.9 - 10.3 mg/dL Final  . Total Protein 01/10/2015 6.7  6.5 - 8.1 g/dL Final  . Albumin 01/10/2015 3.6  3.5 - 5.0 g/dL Final  . AST 01/10/2015 17  15 - 41 U/L Final  . ALT 01/10/2015 14  14 - 54 U/L Final  . Alkaline Phosphatase 01/10/2015 53  38 - 126 U/L Final  . Total Bilirubin 01/10/2015 0.5  0.3 - 1.2 mg/dL Final  . GFR calc non Af Amer 01/10/2015 >60  >60 mL/min Final  . GFR calc Af Amer 01/10/2015 >60  >60 mL/min Final   Comment: (NOTE) The eGFR has been calculated using the CKD EPI equation. This calculation has not been validated in all clinical situations. eGFR's persistently <60 mL/min signify possible Chronic Kidney Disease.   . Anion gap 01/10/2015 8  5 - 15 Final  . Magnesium 01/10/2015 1.9  1.7 - 2.4 mg/dL Final     STUDIES: No results found.  ASSESSMENT:  Invasive adenocarcinoma of cervix, FIGO stage IB 2.  PLAN:   1. Adenocarcinoma of cervix. Patient started weekly cisplatin chemotherapy to run concurrent with daily XRT one week ago. She is for cycle 2 today. She reports overall tolerating extremely well, better than anticipated. She did not require any subsequent IV fluids last week due to nausea or vomiting and reports having neither symptom. She continues to work full-time. Lab data is adequate for continuation of therapy. We'll proceed with cycle 2 cisplatin chemotherapy today. Patient to return in one week for continuation with cycle 3 cisplatin as well as daily for XRT.  Patient expressed understanding and was in agreement with this plan. She also understands that She can call clinic at any time with any questions, concerns, or complaints.   Dr. Oliva Bustard was available for consultation and review of plan of care for this patient.  Cervical cancer ([place])   Staging form: Cervix Uteri, AJCC 7th Edition     Clinical: FIGO Stage IB2 (T1b2, N0) - Signed by Forest Gleason, MD on 12/19/2014   Evlyn Kanner, NP   01/10/2015 9:50 AM

## 2015-01-10 NOTE — Progress Notes (Signed)
Patient no longer smoking.  Does not have living will.

## 2015-01-11 ENCOUNTER — Ambulatory Visit
Admission: RE | Admit: 2015-01-11 | Discharge: 2015-01-11 | Disposition: A | Discharge status: Home / Self Care | Payer: 59 | Source: Ambulatory Visit | Attending: Radiation Oncology | Admitting: Radiation Oncology

## 2015-01-11 DIAGNOSIS — Z510 Encounter for antineoplastic radiation therapy: Secondary | ICD-10-CM | POA: Diagnosis not present

## 2015-01-12 ENCOUNTER — Ambulatory Visit
Admission: RE | Admit: 2015-01-12 | Discharge: 2015-01-12 | Disposition: A | Discharge status: Home / Self Care | Payer: 59 | Source: Ambulatory Visit | Attending: Radiation Oncology | Admitting: Radiation Oncology

## 2015-01-12 DIAGNOSIS — Z510 Encounter for antineoplastic radiation therapy: Secondary | ICD-10-CM | POA: Diagnosis not present

## 2015-01-13 ENCOUNTER — Ambulatory Visit
Admission: RE | Admit: 2015-01-13 | Discharge: 2015-01-13 | Disposition: A | Discharge status: Home / Self Care | Payer: 59 | Source: Ambulatory Visit | Attending: Radiation Oncology | Admitting: Radiation Oncology

## 2015-01-13 DIAGNOSIS — Z510 Encounter for antineoplastic radiation therapy: Secondary | ICD-10-CM | POA: Diagnosis not present

## 2015-01-14 ENCOUNTER — Ambulatory Visit
Admission: RE | Admit: 2015-01-14 | Discharge: 2015-01-14 | Disposition: A | Discharge status: Home / Self Care | Payer: 59 | Source: Ambulatory Visit | Attending: Radiation Oncology | Admitting: Radiation Oncology

## 2015-01-14 DIAGNOSIS — Z510 Encounter for antineoplastic radiation therapy: Secondary | ICD-10-CM | POA: Diagnosis not present

## 2015-01-16 ENCOUNTER — Encounter: Payer: Self-pay | Admitting: Family Medicine

## 2015-01-17 ENCOUNTER — Encounter: Payer: Self-pay | Admitting: Oncology

## 2015-01-17 ENCOUNTER — Inpatient Hospital Stay (HOSPITAL_BASED_OUTPATIENT_CLINIC_OR_DEPARTMENT_OTHER): Payer: 59 | Admitting: Oncology

## 2015-01-17 ENCOUNTER — Ambulatory Visit
Admission: RE | Admit: 2015-01-17 | Discharge: 2015-01-17 | Disposition: A | Discharge status: Home / Self Care | Payer: 59 | Source: Ambulatory Visit | Attending: Radiation Oncology | Admitting: Radiation Oncology

## 2015-01-17 ENCOUNTER — Inpatient Hospital Stay: Payer: 59

## 2015-01-17 VITALS — BP 112/70 | HR 70 | Temp 96.2°F | Wt 124.1 lb

## 2015-01-17 DIAGNOSIS — C539 Malignant neoplasm of cervix uteri, unspecified: Secondary | ICD-10-CM

## 2015-01-17 DIAGNOSIS — Z510 Encounter for antineoplastic radiation therapy: Secondary | ICD-10-CM | POA: Diagnosis not present

## 2015-01-17 DIAGNOSIS — E041 Nontoxic single thyroid nodule: Secondary | ICD-10-CM | POA: Diagnosis not present

## 2015-01-17 DIAGNOSIS — F419 Anxiety disorder, unspecified: Secondary | ICD-10-CM

## 2015-01-17 DIAGNOSIS — F329 Major depressive disorder, single episode, unspecified: Secondary | ICD-10-CM

## 2015-01-17 DIAGNOSIS — F17210 Nicotine dependence, cigarettes, uncomplicated: Secondary | ICD-10-CM

## 2015-01-17 LAB — COMPREHENSIVE METABOLIC PANEL
ALT: 16 U/L (ref 14–54)
AST: 16 U/L (ref 15–41)
Albumin: 3.6 g/dL (ref 3.5–5.0)
Alkaline Phosphatase: 55 U/L (ref 38–126)
Anion gap: 5 (ref 5–15)
BUN: 22 mg/dL — AB (ref 6–20)
CHLORIDE: 105 mmol/L (ref 101–111)
CO2: 25 mmol/L (ref 22–32)
Calcium: 8.5 mg/dL — ABNORMAL LOW (ref 8.9–10.3)
Creatinine, Ser: 0.56 mg/dL (ref 0.44–1.00)
GFR calc Af Amer: >60 mL/min (ref >60)
Glucose, Bld: 105 mg/dL — ABNORMAL HIGH (ref 65–99)
POTASSIUM: 3.7 mmol/L (ref 3.5–5.1)
SODIUM: 135 mmol/L (ref 135–145)
Total Bilirubin: 0.6 mg/dL (ref 0.3–1.2)
Total Protein: 6.1 g/dL — ABNORMAL LOW (ref 6.5–8.1)

## 2015-01-17 LAB — CBC WITH DIFFERENTIAL/PLATELET
BASOS ABS: 0 10*3/uL (ref 0–0.1)
BASOS PCT: 0 %
EOS ABS: 0.1 10*3/uL (ref 0–0.7)
EOS PCT: 2 %
HCT: 38.6 % (ref 35.0–47.0)
Hemoglobin: 13.1 g/dL (ref 12.0–16.0)
LYMPHS PCT: 9 %
Lymphs Abs: 0.6 10*3/uL — ABNORMAL LOW (ref 1.0–3.6)
MCH: 30.7 pg (ref 26.0–34.0)
MCHC: 34 g/dL (ref 32.0–36.0)
MCV: 90.4 fL (ref 80.0–100.0)
MONO ABS: 0.4 10*3/uL (ref 0.2–0.9)
Monocytes Relative: 5 %
Neutro Abs: 5.9 10*3/uL (ref 1.4–6.5)
Neutrophils Relative %: 84 %
PLATELETS: 220 10*3/uL (ref 150–440)
RBC: 4.27 MIL/uL (ref 3.80–5.20)
RDW: 13 % (ref 11.5–14.5)
WBC: 7.1 10*3/uL (ref 3.6–11.0)

## 2015-01-17 LAB — MAGNESIUM: MAGNESIUM: 1.9 mg/dL (ref 1.7–2.4)

## 2015-01-17 MED ORDER — SODIUM CHLORIDE 0.9 % IV SOLN
Freq: Once | INTRAVENOUS | Status: AC
  Administered 2015-01-17: 12:00:00 via INTRAVENOUS
  Filled 2015-01-17: qty 5

## 2015-01-17 MED ORDER — SODIUM CHLORIDE 0.9 % IV SOLN
Freq: Once | INTRAVENOUS | Status: AC
  Administered 2015-01-17: 10:00:00 via INTRAVENOUS
  Filled 2015-01-17: qty 1000

## 2015-01-17 MED ORDER — SODIUM CHLORIDE 0.9 % IV SOLN
40.0000 mg/m2 | Freq: Once | INTRAVENOUS | Status: AC
  Administered 2015-01-17: 66 mg via INTRAVENOUS
  Filled 2015-01-17: qty 66

## 2015-01-17 MED ORDER — PALONOSETRON HCL INJECTION 0.25 MG/5ML
0.2500 mg | Freq: Once | INTRAVENOUS | Status: AC
  Administered 2015-01-17: 0.25 mg via INTRAVENOUS
  Filled 2015-01-17: qty 5

## 2015-01-17 MED ORDER — POTASSIUM CHLORIDE 2 MEQ/ML IV SOLN
Freq: Once | INTRAVENOUS | Status: AC
  Administered 2015-01-17: 10:00:00 via INTRAVENOUS
  Filled 2015-01-17: qty 1000

## 2015-01-18 ENCOUNTER — Ambulatory Visit
Admission: RE | Admit: 2015-01-18 | Discharge: 2015-01-18 | Disposition: A | Discharge status: Home / Self Care | Payer: 59 | Source: Ambulatory Visit | Attending: Radiation Oncology | Admitting: Radiation Oncology

## 2015-01-18 ENCOUNTER — Other Ambulatory Visit: Payer: Self-pay | Admitting: Family Medicine

## 2015-01-18 DIAGNOSIS — Z510 Encounter for antineoplastic radiation therapy: Secondary | ICD-10-CM | POA: Diagnosis not present

## 2015-01-19 ENCOUNTER — Ambulatory Visit
Admission: RE | Admit: 2015-01-19 | Discharge: 2015-01-19 | Disposition: A | Discharge status: Home / Self Care | Payer: 59 | Source: Ambulatory Visit | Attending: Radiation Oncology | Admitting: Radiation Oncology

## 2015-01-19 ENCOUNTER — Inpatient Hospital Stay: Payer: 59

## 2015-01-19 DIAGNOSIS — Z510 Encounter for antineoplastic radiation therapy: Secondary | ICD-10-CM | POA: Diagnosis not present

## 2015-01-19 NOTE — Progress Notes (Signed)
[place] @ [facility] [phone]  Fax:(336) [place] OB: [birth year]  MR#: [id number]  GMW#:[id number]  Patient Care Team: Bobetta Lime, MD as PCP - General (Family Medicine)  CHIEF COMPLAINT:  Chief Complaint  Patient presents with  . 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  [name] OF 2016. Patient starting radiation and chemotherapy with cis-platinum.  VISIT DIAGNOSIS:   Carcinoma of cervix    Oncology Flowsheet 11/09/2014 01/03/2015 01/10/2015 01/17/2015  Day, Cycle - Day 1, Cycle 1 Day 1, Cycle 2 Day 1, Cycle 3  CISplatin (PLATINOL) IV - 40 mg/m2 40 mg/m2 40 mg/m2  dexamethasone (DECADRON) IV - [ 12 mg ] [ 12 mg ] [ 12 mg ]  fosaprepitant (EMEND) IV - [ 150 mg ] [ 150 mg ] [ 150 mg ]  LORazepam (ATIVAN) PO 1 mg - - -  palonosetron (ALOXI) IV - 0.25 mg 0.25 mg 0.25 mg    INTERVAL HISTORY:48 year old lady who is extremely emotional and has been accompanied by a friend.  Patient was diagnosed to have carcinoma of cervix.  Patient had invasive adenocarcinoma of cervix which has been staged as stage IB.  (for detailedl information please see initial staging workup)  Patient is here for the second cycle of chemotherapy with cis-platinum Tolerated last treatment very well patient did received  interim hydration No nausea.  No vomiting. Patient is less anxious and less agitated. Getting radiation therapy.   REVIEW OF SYSTEMS:   GENERAL:  Feels good.  Active.  No fevers, sweats or weight loss. Very emotional crying PERFORMANCE STATUS (ECOG):  0 HEENT:  No visual changes, runny nose, sore throat, mouth sores or tenderness. Lungs: No shortness of breath or cough.  No hemoptysis. Cardiac:  No chest pain, palpitations, orthopnea, or PND. GI:  No nausea, vomiting, diarrhea, constipation, melena or hematochezia. GU:  No  urgency, frequency, dysuria, or hematuria. Musculoskeletal:  No back pain.  No joint pain.  No muscle tenderness. Extremities:  No pain or swelling. Skin:  No rashes or skin changes. Neuro:  No headache, numbness or weakness, balance or coordination issues. Endocrine:  No diabetes, thyroid issues, hot flashes or night sweats. Psych:  Depressed and extremely anxious Pain:  No focal pain. Review of systems:  All other systems reviewed and found to be negative.  As per HPI. Otherwise, a complete review of systems is negatve.  PAST MEDICAL HISTORY: Past Medical History  Diagnosis Date  . Anxiety   . Cervical cancer [facility])     Radical Hysterectomy 11/26/14 with adjuvant chemoradiation    PAST SURGICAL HISTORY: Recent surgery for carcinoma of cervix No other surgical history  FAMILY HISTORY There is no significant family history of breast cancer, ovarian cancer, colon cancer       ADVANCED DIRECTIVES:   Patient does not have any living will or healthcare power of attorney.  Information was given .  Available resources had been discussed.  We will follow-up on subsequent appointments regarding this issue HEALTH MAINTENANCE: Social History  Substance Use Topics  . Smoking status: Former Smoker -- 1.00 packs/day for 20 years    Types: Cigarettes    Quit date: 11/01/2014  . Smokeless tobacco: Never Used  . Alcohol Use: No    el:  Allergies  Allergen Reactions  . Codeine Nausea Only    Current Outpatient Prescriptions  Medication Sig Dispense Refill  .  ALPRAZolam (XANAX) 1 MG tablet TK 1 T PO Q NIGHT PRN FOR SLEEP  0  . Ascorbic Acid (VITAMIN C) 1000 MG tablet Take 1,000 mg by mouth 2 times daily at 12 noon and 4 pm.    . clonazePAM (KLONOPIN) 0.5 MG tablet   1  . Multiple Vitamins-Iron (MULTIVITAMINS WITH IRON) TABS tablet Take 1 tablet by mouth daily.    . Multiple Vitamins-Minerals (MULTIVITAMIN WITH MINERALS) tablet Take 1 tablet by mouth daily.    . nicotine (NICODERM  CQ - DOSED IN MG/24 HOURS) 21 mg/24hr patch Place 1 patch onto the skin daily. Place onto skin    . ondansetron (ZOFRAN) 4 MG tablet Take 1 tablet (4 mg total) by mouth every 6 (six) hours as needed for nausea or vomiting. 30 tablet 3  . sertraline (ZOLOFT) 50 MG tablet Take 1 tablet (50 mg total) by mouth at bedtime. 30 tablet 3  . traZODone (DESYREL) 50 MG tablet TK 1 T PO QD  1  . vitamin B-12 (CYANOCOBALAMIN) 100 MCG tablet Take 100 mcg by mouth daily.     No current facility-administered medications for this visit.    OBJECTIVE: PHYSICAL EXAM: GENERAL:  Well developed, well nourished, sitting comfortably in the exam room in no acute distress. MENTAL STATUS:  Alert and oriented to person, place and time. Extremely apprehensive and agitated. t.  RESPIRATORY:  Clear to auscultation without rales, wheezes or rhonchi. CARDIOVASCULAR:  Regular rate and rhythm without murmur, rub or gallop.  ABDOMEN:  Soft, non-tender, with active bowel sounds, and no hepatosplenomegaly.  No masses. BACK:  No CVA tenderness.  No tenderness on percussion of the back or rib cage. SKIN:  No rashes, ulcers or lesions. EXTREMITIES: No edema, no skin discoloration or tenderness.  No palpable cords. LYMPH NODES: No palpable cervical, supraclavicular, axillary or inguinal adenopathy  NEUROLOGICAL: Unremarkable. PSYCH:  Appropriate.  Filed Vitals:   01/17/15 0904  BP: 112/70  Pulse: 70  Temp: 96.2 F (35.7 C)     Body mass index is 20.65 kg/(m^2).    ECOG FS:0 - Asymptomatic  LAB RESULTS:  Appointment on 01/17/2015  Component Date Value Ref Range Status  . WBC 01/17/2015 7.1  3.6 - 11.0 K/uL Final  . RBC 01/17/2015 4.27  3.80 - 5.20 MIL/uL Final  . Hemoglobin 01/17/2015 13.1  12.0 - 16.0 g/dL Final  . HCT 01/17/2015 38.6  35.0 - 47.0 % Final  . MCV 01/17/2015 90.4  80.0 - 100.0 fL Final  . MCH 01/17/2015 30.7  26.0 - 34.0 pg Final  . MCHC 01/17/2015 34.0  32.0 - 36.0 g/dL Final  . RDW 01/17/2015  13.0  11.5 - 14.5 % Final  . Platelets 01/17/2015 220  150 - 440 K/uL Final  . Neutrophils Relative % 01/17/2015 84   Final  . Neutro Abs 01/17/2015 5.9  1.4 - 6.5 K/uL Final  . Lymphocytes Relative 01/17/2015 9   Final  . Lymphs Abs 01/17/2015 0.6* 1.0 - 3.6 K/uL Final  . Monocytes Relative 01/17/2015 5   Final  . Monocytes Absolute 01/17/2015 0.4  0.2 - 0.9 K/uL Final  . Eosinophils Relative 01/17/2015 2   Final  . Eosinophils Absolute 01/17/2015 0.1  0 - 0.7 K/uL Final  . Basophils Relative 01/17/2015 0   Final  . Basophils Absolute 01/17/2015 0.0  0 - 0.1 K/uL Final  . Sodium 01/17/2015 135  135 - 145 mmol/L Final  . Potassium 01/17/2015 3.7  3.5 - 5.1 mmol/L Final  .  Chloride 01/17/2015 105  101 - 111 mmol/L Final  . CO2 01/17/2015 25  22 - 32 mmol/L Final  . Glucose, Bld 01/17/2015 105* 65 - 99 mg/dL Final  . BUN 01/17/2015 22* 6 - 20 mg/dL Final  . Creatinine, Ser 01/17/2015 0.56  0.44 - 1.00 mg/dL Final  . Calcium 01/17/2015 8.5* 8.9 - 10.3 mg/dL Final  . Total Protein 01/17/2015 6.1* 6.5 - 8.1 g/dL Final  . Albumin 01/17/2015 3.6  3.5 - 5.0 g/dL Final  . AST 01/17/2015 16  15 - 41 U/L Final  . ALT 01/17/2015 16  14 - 54 U/L Final  . Alkaline Phosphatase 01/17/2015 55  38 - 126 U/L Final  . Total Bilirubin 01/17/2015 0.6  0.3 - 1.2 mg/dL Final  . GFR calc non Af Amer 01/17/2015 >60  >60 mL/min Final  . GFR calc Af Amer 01/17/2015 >60  >60 mL/min Final   Comment: (NOTE) The eGFR has been calculated using the CKD EPI equation. This calculation has not been validated in all clinical situations. eGFR's persistently <60 mL/min signify possible Chronic Kidney Disease.   . Anion gap 01/17/2015 5  5 - 15 Final  . Magnesium 01/17/2015 1.9  1.7 - 2.4 mg/dL Final   lab result   From  [facility]  WBC 9.4. Hemoglobin 14.3.  Platelet count is 12.9.   STUDIES: 1.Ultrasound of uterus IMPRESSION: Ovaries not visualized as transvaginal imaging could not be completed.  Probable 4  cm cervical mass is noted. Clinical correlation and direct visualization is recommended.   Electronically Signed  By: Marijo Conception, M.D.  On: 11/09/2014 20:03 2.Pathology report from [facility] has been reviewed 3.PET scan. IMPRESSION: Hypermetabolic mass in the uterine cervix, consistent with known primary cervical carcinoma.  No evidence of nodal or distant metastatic disease.  4 cm hypermetabolic left thyroid lobe nodule. Primary thyroid carcinoma cannot be excluded. Consider thyroid ultrasound and percutaneous needle aspiration for further evaluation. This follows ACR consensus guidelines: Managing Incidental Thyroid Nodules Detected on Imaging: White Paper of the ACR Incidental Thyroid Findings Committee. J Am Coll Radiol 2015; 12:143-150.   Electronically Signed  By: Earle Gell M.D.  On: 11/15/2014 11:36   ASSESSMENT:  Invasive adenocarcinoma of cervix FIGO stage 1 B2. PET scan also shows hypermetabolic thyroid nodule, 4 cm.  PLAN:   Patient has been evaluated by radiation and GYN oncologist.   I discussed situation with Dr. Baruch Gouty, our radiation oncologist today Patient is going to start radiation therapy As per NCCN  criteria there might be an advantage of adding cis-platinum on a weekly basisx5 2.patient will need chemotherapy class 3.  Patient is extremely emotional and will add to increased dose of Zoloft and long-acting and times anxiety medications like Klonopin If needed there is a chance with my to get psychiatric help 4.  Do not see any need for port placement as cis-platinum is only for 5 weeks and patient does have good venous access 5.  Thyroid nodule will be evaluated by ENT surgeon ./ again patient and number of questions.  Patient has tolerated first treatment very well.  However considering patient's extreme anxiety and fear of nausea We would continue hydration on Wednesday with IV fluid IV Zofran and Decadron Continue  cis-platinum therapy Continue radiation therapy Overall tolerance is fairly good All lab data has been reviewed renal functions are within normal limit  According to patient regarding thyroid nodule patient's primary care physician now evaluated by ultrasound I do not have  record available will try to get information but according to patient She was told that ultrasound was normal.  This was done in office ultrasound  Patient expressed understanding and was in agreement with this plan. She also understands that She can call clinic at any time with any questions, concerns, or complaints.       Cervical cancer   Staging form: Cervix Uteri, AJCC 7th Edition     Clinical: FIGO Stage IB2 (T1b2, N0) - Signed by Forest Gleason, MD on 12/19/2014   Forest Gleason, MD   01/19/2015 8:53 AM

## 2015-01-20 ENCOUNTER — Other Ambulatory Visit: Payer: Self-pay | Admitting: *Deleted

## 2015-01-20 ENCOUNTER — Ambulatory Visit
Admission: RE | Admit: 2015-01-20 | Discharge: 2015-01-20 | Disposition: A | Discharge status: Home / Self Care | Payer: 59 | Source: Ambulatory Visit | Attending: Radiation Oncology | Admitting: Radiation Oncology

## 2015-01-20 DIAGNOSIS — Z510 Encounter for antineoplastic radiation therapy: Secondary | ICD-10-CM | POA: Diagnosis not present

## 2015-01-20 MED ORDER — DIPHENOXYLATE-ATROPINE 2.5-0.025 MG PO TABS: 1.0000 | ORAL_TABLET | Freq: Four times a day (QID) | ORAL | Status: DC | PRN

## 2015-01-21 ENCOUNTER — Ambulatory Visit
Admission: RE | Admit: 2015-01-21 | Discharge: 2015-01-21 | Disposition: A | Discharge status: Home / Self Care | Payer: 59 | Source: Ambulatory Visit | Attending: Radiation Oncology | Admitting: Radiation Oncology

## 2015-01-21 DIAGNOSIS — Z510 Encounter for antineoplastic radiation therapy: Secondary | ICD-10-CM | POA: Diagnosis not present

## 2015-01-24 ENCOUNTER — Inpatient Hospital Stay (HOSPITAL_BASED_OUTPATIENT_CLINIC_OR_DEPARTMENT_OTHER): Payer: 59 | Admitting: Oncology

## 2015-01-24 ENCOUNTER — Ambulatory Visit
Admission: RE | Admit: 2015-01-24 | Discharge: 2015-01-24 | Disposition: A | Discharge status: Home / Self Care | Payer: 59 | Source: Ambulatory Visit | Attending: Radiation Oncology | Admitting: Radiation Oncology

## 2015-01-24 ENCOUNTER — Inpatient Hospital Stay: Payer: 59

## 2015-01-24 ENCOUNTER — Encounter: Payer: Self-pay | Admitting: Oncology

## 2015-01-24 VITALS — BP 106/67 | HR 57 | Resp 18

## 2015-01-24 VITALS — BP 115/73 | HR 75 | Temp 96.4°F | Resp 18 | Wt 121.7 lb

## 2015-01-24 DIAGNOSIS — E041 Nontoxic single thyroid nodule: Secondary | ICD-10-CM | POA: Diagnosis not present

## 2015-01-24 DIAGNOSIS — R110 Nausea: Secondary | ICD-10-CM

## 2015-01-24 DIAGNOSIS — F419 Anxiety disorder, unspecified: Secondary | ICD-10-CM

## 2015-01-24 DIAGNOSIS — F329 Major depressive disorder, single episode, unspecified: Secondary | ICD-10-CM

## 2015-01-24 DIAGNOSIS — R197 Diarrhea, unspecified: Secondary | ICD-10-CM | POA: Diagnosis not present

## 2015-01-24 DIAGNOSIS — Z79899 Other long term (current) drug therapy: Secondary | ICD-10-CM

## 2015-01-24 DIAGNOSIS — C539 Malignant neoplasm of cervix uteri, unspecified: Secondary | ICD-10-CM

## 2015-01-24 DIAGNOSIS — Z510 Encounter for antineoplastic radiation therapy: Secondary | ICD-10-CM | POA: Diagnosis not present

## 2015-01-24 DIAGNOSIS — F17210 Nicotine dependence, cigarettes, uncomplicated: Secondary | ICD-10-CM

## 2015-01-24 LAB — CBC WITH DIFFERENTIAL/PLATELET
Basophils Absolute: 0 10*3/uL (ref 0–0.1)
Basophils Relative: 0 %
EOS PCT: 4 %
Eosinophils Absolute: 0.2 10*3/uL (ref 0–0.7)
HCT: 39 % (ref 35.0–47.0)
Hemoglobin: 13.4 g/dL (ref 12.0–16.0)
LYMPHS ABS: 0.5 10*3/uL — AB (ref 1.0–3.6)
LYMPHS PCT: 8 %
MCH: 31 pg (ref 26.0–34.0)
MCHC: 34.4 g/dL (ref 32.0–36.0)
MCV: 90.1 fL (ref 80.0–100.0)
MONO ABS: 0.5 10*3/uL (ref 0.2–0.9)
Monocytes Relative: 8 %
Neutro Abs: 4.9 10*3/uL (ref 1.4–6.5)
Neutrophils Relative %: 80 %
PLATELETS: 205 10*3/uL (ref 150–440)
RBC: 4.33 MIL/uL (ref 3.80–5.20)
RDW: 13.3 % (ref 11.5–14.5)
WBC: 6.2 10*3/uL (ref 3.6–11.0)

## 2015-01-24 LAB — COMPREHENSIVE METABOLIC PANEL
ALT: 20 U/L (ref 14–54)
AST: 18 U/L (ref 15–41)
Albumin: 3.3 g/dL — ABNORMAL LOW (ref 3.5–5.0)
Alkaline Phosphatase: 53 U/L (ref 38–126)
Anion gap: 6 (ref 5–15)
BUN: 22 mg/dL — ABNORMAL HIGH (ref 6–20)
CHLORIDE: 104 mmol/L (ref 101–111)
CO2: 25 mmol/L (ref 22–32)
CREATININE: 0.65 mg/dL (ref 0.44–1.00)
Calcium: 8.5 mg/dL — ABNORMAL LOW (ref 8.9–10.3)
Glucose, Bld: 114 mg/dL — ABNORMAL HIGH (ref 65–99)
POTASSIUM: 3.9 mmol/L (ref 3.5–5.1)
Sodium: 135 mmol/L (ref 135–145)
TOTAL PROTEIN: 6 g/dL — AB (ref 6.5–8.1)
Total Bilirubin: 0.4 mg/dL (ref 0.3–1.2)

## 2015-01-24 LAB — MAGNESIUM: MAGNESIUM: 1.6 mg/dL — AB (ref 1.7–2.4)

## 2015-01-24 MED ORDER — POTASSIUM CHLORIDE 2 MEQ/ML IV SOLN
Freq: Once | INTRAVENOUS | Status: AC
  Administered 2015-01-24: 10:00:00 via INTRAVENOUS
  Filled 2015-01-24: qty 1000

## 2015-01-24 MED ORDER — DIPHENOXYLATE-ATROPINE 2.5-0.025 MG PO TABS: 1.0000 | ORAL_TABLET | Freq: Four times a day (QID) | ORAL | Status: DC | PRN

## 2015-01-24 MED ORDER — SODIUM CHLORIDE 0.9 % IV SOLN
Freq: Once | INTRAVENOUS | Status: AC
  Administered 2015-01-24: 10:00:00 via INTRAVENOUS
  Filled 2015-01-24: qty 1000

## 2015-01-24 MED ORDER — PALONOSETRON HCL INJECTION 0.25 MG/5ML
0.2500 mg | Freq: Once | INTRAVENOUS | Status: AC
  Administered 2015-01-24: 0.25 mg via INTRAVENOUS
  Filled 2015-01-24: qty 5

## 2015-01-24 MED ORDER — HEPARIN SOD (PORK) LOCK FLUSH 100 UNIT/ML IV SOLN: 500.0000 [IU] | Freq: Once | INTRAVENOUS | Status: DC | PRN

## 2015-01-24 MED ORDER — SODIUM CHLORIDE 0.9 % IV SOLN
30.0000 mg/m2 | Freq: Once | INTRAVENOUS | Status: AC
  Administered 2015-01-24: 49 mg via INTRAVENOUS
  Filled 2015-01-24: qty 49

## 2015-01-24 MED ORDER — FOSAPREPITANT DIMEGLUMINE INJECTION 150 MG
Freq: Once | INTRAVENOUS | Status: AC
  Administered 2015-01-24: 12:00:00 via INTRAVENOUS
  Filled 2015-01-24: qty 5

## 2015-01-24 MED ORDER — ONDANSETRON HCL 4 MG PO TABS: ORAL_TABLET | ORAL | Status: DC

## 2015-01-24 NOTE — Progress Notes (Signed)
[place] @ [facility] [phone]) [phone]  Fax:(336) [place] OB: 1965-10-30  MR#: [id number]  LOV#:[id number]  Patient Care Team: Bobetta Lime, MD as PCP - General (Family Medicine)  CHIEF COMPLAINT:  Chief Complaint  Patient presents with  . 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  [name] OF 2016. Patient starting radiation and chemotherapy with cis-platinum.  VISIT DIAGNOSIS:   Carcinoma of cervix    Oncology Flowsheet 11/09/2014 01/03/2015 01/10/2015 01/17/2015  Day, Cycle - Day 1, Cycle 1 Day 1, Cycle 2 Day 1, Cycle 3  CISplatin (PLATINOL) IV - 40 mg/m2 40 mg/m2 40 mg/m2  dexamethasone (DECADRON) IV - [ 12 mg ] [ 12 mg ] [ 12 mg ]  fosaprepitant (EMEND) IV - [ 150 mg ] [ 150 mg ] [ 150 mg ]  LORazepam (ATIVAN) PO 1 mg - - -  palonosetron (ALOXI) IV - 0.25 mg 0.25 mg 0.25 mg    INTERVAL HISTORY:49 year old lady who is extremely emotional and has been accompanied by a friend.  Patient was diagnosed to have carcinoma of cervix.  Patient had invasive adenocarcinoma of cervix which has been staged as stage IB.  (for detailedl information please see initial staging workup)  Patient is here for continuation of chemotherapy has developed diarrhea for which patient is taking Lomotil.  Lomotil is helping patient however this is worried about constipation. Patient complains of some nausea taking Zofran around the clock Having problems getting Zofran filled. Drinking enough fluid. Somewhat anxious as patient has to work  REVIEW OF SYSTEMS:   GENERAL:  Feels good.  Active.  No fevers, sweats or weight loss. Very emotional crying PERFORMANCE STATUS (ECOG):  0 HEENT:  No visual changes, runny nose, sore throat, mouth sores or tenderness. Lungs: No shortness of breath or cough.  No hemoptysis. Cardiac:  No chest  pain, palpitations, orthopnea, or PND. GI:  As described in history of present illness GU:  No urgency, frequency, dysuria, or hematuria. Musculoskeletal:  No back pain.  No joint pain.  No muscle tenderness. Extremities:  No pain or swelling. Skin:  No rashes or skin changes. Neuro:  No headache, numbness or weakness, balance or coordination issues. Endocrine:  No diabetes, thyroid issues, hot flashes or night sweats. Psych:  Depressed and extremely anxious Pain:  No focal pain. Review of systems:  All other systems reviewed and found to be negative.  As per HPI. Otherwise, a complete review of systems is negatve.  PAST MEDICAL HISTORY: Past Medical History  Diagnosis Date  . Anxiety   . Cervical cancer [facility])     Radical Hysterectomy 11/26/14 with adjuvant chemoradiation    PAST SURGICAL HISTORY: Recent surgery for carcinoma of cervix No other surgical history  FAMILY HISTORY There is no significant family history of breast cancer, ovarian cancer, colon cancer       ADVANCED DIRECTIVES:   Patient does not have any living will or healthcare power of attorney.  Information was given .  Available resources had been discussed.  We will follow-up on subsequent appointments regarding this issue HEALTH MAINTENANCE: Social History  Substance Use Topics  . Smoking status: Former Smoker -- 1.00 packs/day for 20 years    Types: Cigarettes    Quit date: 11/01/2014  . Smokeless tobacco: Never Used  . Alcohol Use: No    el:  Allergies  Allergen  Reactions  . Codeine Nausea Only    Current Outpatient Prescriptions  Medication Sig Dispense Refill  . ALPRAZolam (XANAX) 1 MG tablet TK 1 T PO Q NIGHT PRN FOR SLEEP  0  . Ascorbic Acid (VITAMIN C) 1000 MG tablet Take 1,000 mg by mouth 2 times daily at 12 noon and 4 pm.    . clonazePAM (KLONOPIN) 0.5 MG tablet   1  . diphenoxylate-atropine (LOMOTIL) 2.5-0.025 MG tablet Take 1 tablet by mouth 4 (four) times daily as needed for  diarrhea or loose stools. 30 tablet 0  . Multiple Vitamins-Iron (MULTIVITAMINS WITH IRON) TABS tablet Take 1 tablet by mouth daily.    . Multiple Vitamins-Minerals (MULTIVITAMIN WITH MINERALS) tablet Take 1 tablet by mouth daily.    . nicotine (NICODERM CQ - DOSED IN MG/24 HOURS) 21 mg/24hr patch Place 1 patch onto the skin daily. Place onto skin    . ondansetron (ZOFRAN) 4 MG tablet Take 1 tablet (4 mg total) by mouth every 6 (six) hours as needed for nausea or vomiting. 30 tablet 3  . sertraline (ZOLOFT) 50 MG tablet Take 1 tablet (50 mg total) by mouth at bedtime. 30 tablet 3  . traZODone (DESYREL) 50 MG tablet TK 1 T PO QD  1  . vitamin B-12 (CYANOCOBALAMIN) 100 MCG tablet Take 100 mcg by mouth daily.     No current facility-administered medications for this visit.    OBJECTIVE: PHYSICAL EXAM: GENERAL:  Well developed, well nourished, sitting comfortably in the exam room in no acute distress. MENTAL STATUS:  Alert and oriented to person, place and time. Extremely apprehensive and agitated. t.  RESPIRATORY:  Clear to auscultation without rales, wheezes or rhonchi. CARDIOVASCULAR:  Regular rate and rhythm without murmur, rub or gallop.  ABDOMEN: Slight tenderness in abdominal area.  Liver and spleen not palpable.  Hyperactive bowel sounds BACK:  No CVA tenderness.  No tenderness on percussion of the back or rib cage. SKIN:  No rashes, ulcers or lesions. EXTREMITIES: No edema, no skin discoloration or tenderness.  No palpable cords. LYMPH NODES: No palpable cervical, supraclavicular, axillary or inguinal adenopathy  NEUROLOGICAL: Unremarkable. PSYCH:  Appropriate.  Filed Vitals:   01/24/15 0848  BP: 115/73  Pulse: 75  Temp: 96.4 F (35.8 C)  Resp: 18     Body mass index is 20.25 kg/(m^2).    ECOG FS:0 - Asymptomatic  LAB RESULTS:  No visits with results within 5 Day(s) from this visit. Latest known visit with results is:  Appointment on 01/17/2015  Component Date Value  Ref Range Status  . WBC 01/17/2015 7.1  3.6 - 11.0 K/uL Final  . RBC 01/17/2015 4.27  3.80 - 5.20 MIL/uL Final  . Hemoglobin 01/17/2015 13.1  12.0 - 16.0 g/dL Final  . HCT 01/17/2015 38.6  35.0 - 47.0 % Final  . MCV 01/17/2015 90.4  80.0 - 100.0 fL Final  . MCH 01/17/2015 30.7  26.0 - 34.0 pg Final  . MCHC 01/17/2015 34.0  32.0 - 36.0 g/dL Final  . RDW 01/17/2015 13.0  11.5 - 14.5 % Final  . Platelets 01/17/2015 220  150 - 440 K/uL Final  . Neutrophils Relative % 01/17/2015 84   Final  . Neutro Abs 01/17/2015 5.9  1.4 - 6.5 K/uL Final  . Lymphocytes Relative 01/17/2015 9   Final  . Lymphs Abs 01/17/2015 0.6* 1.0 - 3.6 K/uL Final  . Monocytes Relative 01/17/2015 5   Final  . Monocytes Absolute 01/17/2015 0.4  0.2 -  0.9 K/uL Final  . Eosinophils Relative 01/17/2015 2   Final  . Eosinophils Absolute 01/17/2015 0.1  0 - 0.7 K/uL Final  . Basophils Relative 01/17/2015 0   Final  . Basophils Absolute 01/17/2015 0.0  0 - 0.1 K/uL Final  . Sodium 01/17/2015 135  135 - 145 mmol/L Final  . Potassium 01/17/2015 3.7  3.5 - 5.1 mmol/L Final  . Chloride 01/17/2015 105  101 - 111 mmol/L Final  . CO2 01/17/2015 25  22 - 32 mmol/L Final  . Glucose, Bld 01/17/2015 105* 65 - 99 mg/dL Final  . BUN 01/17/2015 22* 6 - 20 mg/dL Final  . Creatinine, Ser 01/17/2015 0.56  0.44 - 1.00 mg/dL Final  . Calcium 01/17/2015 8.5* 8.9 - 10.3 mg/dL Final  . Total Protein 01/17/2015 6.1* 6.5 - 8.1 g/dL Final  . Albumin 01/17/2015 3.6  3.5 - 5.0 g/dL Final  . AST 01/17/2015 16  15 - 41 U/L Final  . ALT 01/17/2015 16  14 - 54 U/L Final  . Alkaline Phosphatase 01/17/2015 55  38 - 126 U/L Final  . Total Bilirubin 01/17/2015 0.6  0.3 - 1.2 mg/dL Final  . GFR calc non Af Amer 01/17/2015 >60  >60 mL/min Final  . GFR calc Af Amer 01/17/2015 >60  >60 mL/min Final   Comment: (NOTE) The eGFR has been calculated using the CKD EPI equation. This calculation has not been validated in all clinical situations. eGFR's persistently  <60 mL/min signify possible Chronic Kidney Disease.   . Anion gap 01/17/2015 5  5 - 15 Final  . Magnesium 01/17/2015 1.9  1.7 - 2.4 mg/dL Final   lab result   From  [facility]  WBC 9.4. Hemoglobin 14.3.  Platelet count is 12.9.   STUDIES: 1.Ultrasound of uterus IMPRESSION: Ovaries not visualized as transvaginal imaging could not be completed.  Probable 4 cm cervical mass is noted. Clinical correlation and direct visualization is recommended.   Electronically Signed  By: Marijo Conception, M.D.  On: 11/09/2014 20:03 2.Pathology report from [facility] has been reviewed 3.PET scan. IMPRESSION: Hypermetabolic mass in the uterine cervix, consistent with known primary cervical carcinoma.  No evidence of nodal or distant metastatic disease.  4 cm hypermetabolic left thyroid lobe nodule. Primary thyroid carcinoma cannot be excluded. Consider thyroid ultrasound and percutaneous needle aspiration for further evaluation. This follows ACR consensus guidelines: Managing Incidental Thyroid Nodules Detected on Imaging: White Paper of the ACR Incidental Thyroid Findings Committee. J Am Coll Radiol 2015; 12:143-150.   Electronically Signed  By: Earle Gell M.D.  On: 11/15/2014 11:36   ASSESSMENT:  Invasive adenocarcinoma of cervix FIGO stage 1 B2. PET scan also shows hypermetabolic thyroid nodule, 4 cm. Diarrhea and nausea secondary to radiation and chemotherapy Will get patient Sandostatin 200 g subcutaneous Patient will continue Lomotil and Imodium as needed Continue chemotherapy depending on the blood results Lab data will be reviewed as soon as is available Continue radiation therapy PLAN:   Patient has been evaluated by radiation and GYN oncologist.   I discussed situation with Dr. Baruch Gouty, our radiation oncologist today Patient is going to start radiation therapy As per NCCN  criteria there might be an advantage of adding cis-platinum on a weekly  basisx5 2.patient will need chemotherapy class 3.  Patient is extremely emotional and will add to increased dose of Zoloft and long-acting and times anxiety medications like Klonopin If needed there is a chance with my to get psychiatric help 4.  Do  not see any need for port placement as cis-platinum is only for 5 weeks and patient does have good venous access 5.  Thyroid nodule will be evaluated by ENT surgeon ./ again patient and number of questions.      Cervical cancer   Staging form: Cervix Uteri, AJCC 7th Edition     Clinical: FIGO Stage IB2 (T1b2, N0) - Signed by Forest Gleason, MD on 12/19/2014   Forest Gleason, MD   01/24/2015 8:57 AM

## 2015-01-25 ENCOUNTER — Telehealth: Payer: Self-pay | Admitting: *Deleted

## 2015-01-25 ENCOUNTER — Ambulatory Visit
Admission: RE | Admit: 2015-01-25 | Discharge: 2015-01-25 | Disposition: A | Discharge status: Home / Self Care | Payer: 59 | Source: Ambulatory Visit | Attending: Radiation Oncology | Admitting: Radiation Oncology

## 2015-01-25 DIAGNOSIS — Z510 Encounter for antineoplastic radiation therapy: Secondary | ICD-10-CM | POA: Diagnosis not present

## 2015-01-25 DIAGNOSIS — C539 Malignant neoplasm of cervix uteri, unspecified: Secondary | ICD-10-CM

## 2015-01-25 MED ORDER — PROMETHAZINE HCL 25 MG PO TABS: 25.0000 mg | ORAL_TABLET | Freq: Four times a day (QID) | ORAL | Status: DC | PRN

## 2015-01-25 NOTE — Telephone Encounter (Signed)
Insurance has denied coverage of ondansetron and will only allow #15 per month. Per MD, pt can have Rx for ondansetron #15 per month in addition to promethazine 25mg  q6 hours PRN for nausea, vomiting. Rx escribed to pharmacy. Will discuss with patient at radiation appt today.

## 2015-01-26 ENCOUNTER — Ambulatory Visit: Payer: 59

## 2015-01-26 ENCOUNTER — Ambulatory Visit
Admission: RE | Admit: 2015-01-26 | Discharge: 2015-01-26 | Disposition: A | Discharge status: Home / Self Care | Payer: 59 | Source: Ambulatory Visit | Attending: Radiation Oncology | Admitting: Radiation Oncology

## 2015-01-26 DIAGNOSIS — Z510 Encounter for antineoplastic radiation therapy: Secondary | ICD-10-CM | POA: Diagnosis not present

## 2015-01-27 ENCOUNTER — Ambulatory Visit
Admission: RE | Admit: 2015-01-27 | Discharge: 2015-01-27 | Disposition: A | Discharge status: Home / Self Care | Payer: 59 | Source: Ambulatory Visit | Attending: Radiation Oncology | Admitting: Radiation Oncology

## 2015-01-27 DIAGNOSIS — Z510 Encounter for antineoplastic radiation therapy: Secondary | ICD-10-CM | POA: Diagnosis not present

## 2015-01-28 ENCOUNTER — Ambulatory Visit
Admission: RE | Admit: 2015-01-28 | Discharge: 2015-01-28 | Disposition: A | Discharge status: Home / Self Care | Payer: 59 | Source: Ambulatory Visit | Attending: Radiation Oncology | Admitting: Radiation Oncology

## 2015-01-28 DIAGNOSIS — Z510 Encounter for antineoplastic radiation therapy: Secondary | ICD-10-CM | POA: Diagnosis not present

## 2015-01-30 ENCOUNTER — Ambulatory Visit
Admission: RE | Admit: 2015-01-30 | Discharge: 2015-01-30 | Disposition: A | Discharge status: Home / Self Care | Payer: 59 | Source: Ambulatory Visit | Attending: Radiation Oncology | Admitting: Radiation Oncology

## 2015-01-31 ENCOUNTER — Inpatient Hospital Stay: Payer: 59

## 2015-01-31 ENCOUNTER — Inpatient Hospital Stay (HOSPITAL_BASED_OUTPATIENT_CLINIC_OR_DEPARTMENT_OTHER): Payer: 59 | Admitting: Oncology

## 2015-01-31 ENCOUNTER — Encounter: Payer: Self-pay | Admitting: Oncology

## 2015-01-31 ENCOUNTER — Ambulatory Visit
Admission: RE | Admit: 2015-01-31 | Discharge: 2015-01-31 | Disposition: A | Discharge status: Home / Self Care | Payer: 59 | Source: Ambulatory Visit | Attending: Radiation Oncology | Admitting: Radiation Oncology

## 2015-01-31 ENCOUNTER — Telehealth: Payer: Self-pay | Admitting: Pharmacist

## 2015-01-31 VITALS — BP 109/72 | HR 77 | Temp 97.1°F | Wt 123.1 lb

## 2015-01-31 DIAGNOSIS — F329 Major depressive disorder, single episode, unspecified: Secondary | ICD-10-CM

## 2015-01-31 DIAGNOSIS — C539 Malignant neoplasm of cervix uteri, unspecified: Secondary | ICD-10-CM

## 2015-01-31 DIAGNOSIS — Z79899 Other long term (current) drug therapy: Secondary | ICD-10-CM

## 2015-01-31 DIAGNOSIS — E041 Nontoxic single thyroid nodule: Secondary | ICD-10-CM

## 2015-01-31 DIAGNOSIS — R110 Nausea: Secondary | ICD-10-CM | POA: Diagnosis not present

## 2015-01-31 DIAGNOSIS — R197 Diarrhea, unspecified: Secondary | ICD-10-CM

## 2015-01-31 DIAGNOSIS — F419 Anxiety disorder, unspecified: Secondary | ICD-10-CM

## 2015-01-31 DIAGNOSIS — F17210 Nicotine dependence, cigarettes, uncomplicated: Secondary | ICD-10-CM

## 2015-01-31 DIAGNOSIS — Z510 Encounter for antineoplastic radiation therapy: Secondary | ICD-10-CM | POA: Diagnosis not present

## 2015-01-31 LAB — COMPREHENSIVE METABOLIC PANEL
ALBUMIN: 3.3 g/dL — AB (ref 3.5–5.0)
ALK PHOS: 53 U/L (ref 38–126)
ALT: 16 U/L (ref 14–54)
ANION GAP: 4 — AB (ref 5–15)
AST: 20 U/L (ref 15–41)
BILIRUBIN TOTAL: 0.5 mg/dL (ref 0.3–1.2)
BUN: 20 mg/dL (ref 6–20)
CALCIUM: 8.3 mg/dL — AB (ref 8.9–10.3)
CO2: 25 mmol/L (ref 22–32)
Chloride: 104 mmol/L (ref 101–111)
Creatinine, Ser: 0.54 mg/dL (ref 0.44–1.00)
GFR calc Af Amer: >60 mL/min (ref >60)
GLUCOSE: 109 mg/dL — AB (ref 65–99)
Potassium: 3.7 mmol/L (ref 3.5–5.1)
Sodium: 133 mmol/L — ABNORMAL LOW (ref 135–145)
TOTAL PROTEIN: 6.2 g/dL — AB (ref 6.5–8.1)

## 2015-01-31 LAB — CBC WITH DIFFERENTIAL/PLATELET
BASOS PCT: 1 %
Basophils Absolute: 0 10*3/uL (ref 0–0.1)
Eosinophils Absolute: 0.2 10*3/uL (ref 0–0.7)
Eosinophils Relative: 3 %
HEMATOCRIT: 37.4 % (ref 35.0–47.0)
HEMOGLOBIN: 12.8 g/dL (ref 12.0–16.0)
LYMPHS ABS: 0.6 10*3/uL — AB (ref 1.0–3.6)
LYMPHS PCT: 8 %
MCH: 30.8 pg (ref 26.0–34.0)
MCHC: 34.4 g/dL (ref 32.0–36.0)
MCV: 89.5 fL (ref 80.0–100.0)
MONO ABS: 0.7 10*3/uL (ref 0.2–0.9)
MONOS PCT: 9 %
NEUTROS ABS: 5.8 10*3/uL (ref 1.4–6.5)
NEUTROS PCT: 79 %
Platelets: 216 10*3/uL (ref 150–440)
RBC: 4.17 MIL/uL (ref 3.80–5.20)
RDW: 13.5 % (ref 11.5–14.5)
WBC: 7.3 10*3/uL (ref 3.6–11.0)

## 2015-01-31 LAB — MAGNESIUM: MAGNESIUM: 1.5 mg/dL — AB (ref 1.7–2.4)

## 2015-01-31 MED ORDER — FOSAPREPITANT DIMEGLUMINE INJECTION 150 MG
Freq: Once | INTRAVENOUS | Status: AC
  Administered 2015-01-31: 13:00:00 via INTRAVENOUS
  Filled 2015-01-31: qty 5

## 2015-01-31 MED ORDER — SODIUM CHLORIDE 0.9 % IV SOLN
Freq: Once | INTRAVENOUS | Status: AC
  Administered 2015-01-31: 11:00:00 via INTRAVENOUS
  Filled 2015-01-31: qty 1000

## 2015-01-31 MED ORDER — SODIUM CHLORIDE 0.9 % IV SOLN
40.0000 mg/m2 | Freq: Once | INTRAVENOUS | Status: AC
  Administered 2015-01-31: 66 mg via INTRAVENOUS
  Filled 2015-01-31: qty 66

## 2015-01-31 MED ORDER — POTASSIUM CHLORIDE 2 MEQ/ML IV SOLN
Freq: Once | INTRAVENOUS | Status: AC
  Administered 2015-01-31: 11:00:00 via INTRAVENOUS
  Filled 2015-01-31: qty 1000

## 2015-01-31 MED ORDER — PALONOSETRON HCL INJECTION 0.25 MG/5ML
0.2500 mg | Freq: Once | INTRAVENOUS | Status: AC
  Administered 2015-01-31: 0.25 mg via INTRAVENOUS
  Filled 2015-01-31: qty 5

## 2015-01-31 NOTE — Progress Notes (Signed)
Patient has had nausea but has been unable to get her Zofran.  She has used phenergan but states she does not like the way it makes her feel.  She can refill Zofran on Wednesday.

## 2015-01-31 NOTE — Telephone Encounter (Signed)
Spoke with Dr. Oliva Bustard. Patient's dose of cisplatin was decreased last cycle due to some patient complaints. Dose will be increased back to 40mg /m2 for todays dose.

## 2015-01-31 NOTE — Progress Notes (Signed)
[place] @ [facility] Telephone:(336) [phone]  Fax:(336) [place] OB: 1965/06/08  MR#: [id number]  GEX#:[id number]  Patient Care Team: Bobetta Lime, MD as PCP - General (Family Medicine)  CHIEF COMPLAINT:  Chief Complaint  Patient presents with  . 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  Deanna Haynes OF 2016. Patient starting radiation and chemotherapy with cis-platinum.  VISIT DIAGNOSIS:   Carcinoma of cervix    Oncology Flowsheet 11/09/2014 01/03/2015 01/10/2015 01/17/2015 01/24/2015  Day, Cycle - Day 1, Cycle 1 Day 1, Cycle 2 Day 1, Cycle 3 Day 1, Cycle 4  CISplatin (PLATINOL) IV - 40 mg/m2 40 mg/m2 40 mg/m2 30 mg/m2  dexamethasone (DECADRON) IV - [ 12 mg ] [ 12 mg ] [ 12 mg ] [ 12 mg ]  fosaprepitant (EMEND) IV - [ 150 mg ] [ 150 mg ] [ 150 mg ] [ 150 mg ]  LORazepam (ATIVAN) PO 1 mg - - - -  palonosetron (ALOXI) IV - 0.25 mg 0.25 mg 0.25 mg 0.25 mg    INTERVAL HISTORY:49 year old lady who is extremely emotional and has been accompanied by a friend.  Patient was diagnosed to have carcinoma of cervix.  Patient had invasive adenocarcinoma of cervix which has been staged as stage IB.  (for detailedl information please see initial staging workup)  Patient is here for continuation of chemotherapy has developed diarrhea for which patient is taking Lomotil.  Lomotil is helping patient however this is worried about constipation. Patient complains of some nausea taking Zofran around the clock Having problems getting Zofran filled. Drinking enough fluid. Somewhat anxious as patient has to work Patient has 1 more week of radiation therapy left.  Feeling somewhat weak and tired.  Patient's nausea medication was not approved to taking Phenergan.  Not controlling nausea. REVIEW OF SYSTEMS:   GENERAL:  Feels good.  Active.  No fevers, sweats or weight  loss. Very emotional crying PERFORMANCE STATUS (ECOG):  0 HEENT:  No visual changes, runny nose, sore throat, mouth sores or tenderness. Lungs: No shortness of breath or cough.  No hemoptysis. Cardiac:  No chest pain, palpitations, orthopnea, or PND. GI:  As described in history of present illness GU:  No urgency, frequency, dysuria, or hematuria. Musculoskeletal:  No back pain.  No joint pain.  No muscle tenderness. Extremities:  No pain or swelling. Skin:  No rashes or skin changes. Neuro:  No headache, numbness or weakness, balance or coordination issues. Endocrine:  No diabetes, thyroid issues, hot flashes or night sweats. Psych:  Depressed and extremely anxious Pain:  No focal pain. Review of systems:  All other systems reviewed and found to be negative.  As per HPI. Otherwise, a complete review of systems is negatve.  PAST MEDICAL HISTORY: Past Medical History  Diagnosis Date  . Anxiety   . Cervical cancer [facility])     Radical Hysterectomy 11/26/14 with adjuvant chemoradiation    PAST SURGICAL HISTORY: Recent surgery for carcinoma of cervix No other surgical history  FAMILY HISTORY There is no significant family history of breast cancer, ovarian cancer, colon cancer       ADVANCED DIRECTIVES:   Patient does not have any living will or healthcare power of attorney.  Information was given .  Available resources had been discussed.  We will follow-up on subsequent appointments regarding this issue HEALTH MAINTENANCE: Social History  Substance Use Topics  .  Smoking status: Former Smoker -- 1.00 packs/day for 20 years    Types: Cigarettes    Quit date: 11/01/2014  . Smokeless tobacco: Never Used  . Alcohol Use: No    el:  Allergies  Allergen Reactions  . Codeine Nausea Only    Current Outpatient Prescriptions  Medication Sig Dispense Refill  . ALPRAZolam (XANAX) 1 MG tablet TK 1 T PO Q NIGHT PRN FOR SLEEP  0  . Ascorbic Acid (VITAMIN C) 1000 MG tablet Take  1,000 mg by mouth 2 times daily at 12 noon and 4 pm.    . clonazePAM (KLONOPIN) 0.5 MG tablet   1  . diphenoxylate-atropine (LOMOTIL) 2.5-0.025 MG tablet Take 1 tablet by mouth 4 (four) times daily as needed for diarrhea or loose stools. 30 tablet 0  . Multiple Vitamins-Iron (MULTIVITAMINS WITH IRON) TABS tablet Take 1 tablet by mouth daily.    . Multiple Vitamins-Minerals (MULTIVITAMIN WITH MINERALS) tablet Take 1 tablet by mouth daily.    . nicotine (NICODERM CQ - DOSED IN MG/24 HOURS) 21 mg/24hr patch Place 1 patch onto the skin daily. Place onto skin    . ondansetron (ZOFRAN) 4 MG tablet Take 1-2 tabs PO every 4 hours PRN for nausea, vomiting 60 tablet 3  . promethazine (PHENERGAN) 25 MG tablet Take 1 tablet (25 mg total) by mouth every 6 (six) hours as needed for nausea or vomiting. 30 tablet 3  . sertraline (ZOLOFT) 50 MG tablet Take 1 tablet (50 mg total) by mouth at bedtime. 30 tablet 3  . traZODone (DESYREL) 50 MG tablet TK 1 T PO QD  1  . vitamin B-12 (CYANOCOBALAMIN) 100 MCG tablet Take 100 mcg by mouth daily.     No current facility-administered medications for this visit.    OBJECTIVE: PHYSICAL EXAM: GENERAL:  Well developed, well nourished, sitting comfortably in the exam room in no acute distress. MENTAL STATUS:  Alert and oriented to person, place and time. Extremely apprehensive and agitated. t.  RESPIRATORY:  Clear to auscultation without rales, wheezes or rhonchi. CARDIOVASCULAR:  Regular rate and rhythm without murmur, rub or gallop.  ABDOMEN: Slight tenderness in abdominal area.  Liver and spleen not palpable.  Hyperactive bowel sounds BACK:  No CVA tenderness.  No tenderness on percussion of the back or rib cage. SKIN:  No rashes, ulcers or lesions. EXTREMITIES: No edema, no skin discoloration or tenderness.  No palpable cords. LYMPH NODES: No palpable cervical, supraclavicular, axillary or inguinal adenopathy  NEUROLOGICAL: Unremarkable. PSYCH:   Appropriate.  Filed Vitals:   01/31/15 0910  BP: 109/72  Pulse: 77  Temp: 97.1 F (36.2 C)     Body mass index is 20.49 kg/(m^2).    ECOG FS:0 - Asymptomatic  LAB RESULTS:  Appointment on 01/31/2015  Component Date Value Ref Range Status  . WBC 01/31/2015 7.3  3.6 - 11.0 K/uL Final  . RBC 01/31/2015 4.17  3.80 - 5.20 MIL/uL Final  . Hemoglobin 01/31/2015 12.8  12.0 - 16.0 g/dL Final  . HCT 01/31/2015 37.4  35.0 - 47.0 % Final  . MCV 01/31/2015 89.5  80.0 - 100.0 fL Final  . MCH 01/31/2015 30.8  26.0 - 34.0 pg Final  . MCHC 01/31/2015 34.4  32.0 - 36.0 g/dL Final  . RDW 01/31/2015 13.5  11.5 - 14.5 % Final  . Platelets 01/31/2015 216  150 - 440 K/uL Final  . Neutrophils Relative % 01/31/2015 79   Final  . Neutro Abs 01/31/2015 5.8  1.4 -  6.5 K/uL Final  . Lymphocytes Relative 01/31/2015 8   Final  . Lymphs Abs 01/31/2015 0.6* 1.0 - 3.6 K/uL Final  . Monocytes Relative 01/31/2015 9   Final  . Monocytes Absolute 01/31/2015 0.7  0.2 - 0.9 K/uL Final  . Eosinophils Relative 01/31/2015 3   Final  . Eosinophils Absolute 01/31/2015 0.2  0 - 0.7 K/uL Final  . Basophils Relative 01/31/2015 1   Final  . Basophils Absolute 01/31/2015 0.0  0 - 0.1 K/uL Final  . Sodium 01/31/2015 133* 135 - 145 mmol/L Final  . Potassium 01/31/2015 3.7  3.5 - 5.1 mmol/L Final  . Chloride 01/31/2015 104  101 - 111 mmol/L Final  . CO2 01/31/2015 25  22 - 32 mmol/L Final  . Glucose, Bld 01/31/2015 109* 65 - 99 mg/dL Final  . BUN 01/31/2015 20  6 - 20 mg/dL Final  . Creatinine, Ser 01/31/2015 0.54  0.44 - 1.00 mg/dL Final  . Calcium 01/31/2015 8.3* 8.9 - 10.3 mg/dL Final  . Total Protein 01/31/2015 6.2* 6.5 - 8.1 g/dL Final  . Albumin 01/31/2015 3.3* 3.5 - 5.0 g/dL Final  . AST 01/31/2015 20  15 - 41 U/L Final  . ALT 01/31/2015 16  14 - 54 U/L Final  . Alkaline Phosphatase 01/31/2015 53  38 - 126 U/L Final  . Total Bilirubin 01/31/2015 0.5  0.3 - 1.2 mg/dL Final  . GFR calc non Af Amer 01/31/2015 >60  >60  mL/min Final  . GFR calc Af Amer 01/31/2015 >60  >60 mL/min Final   Comment: (NOTE) The eGFR has been calculated using the CKD EPI equation. This calculation has not been validated in all clinical situations. eGFR's persistently <60 mL/min signify possible Chronic Kidney Disease.   . Anion gap 01/31/2015 4* 5 - 15 Final  . Magnesium 01/31/2015 1.5* 1.7 - 2.4 mg/dL Final   lab result   From  [facility]  WBC 9.4. Hemoglobin 14.3.  Platelet count is 12.9.   STUDIES: 1.Ultrasound of uterus IMPRESSION: Ovaries not visualized as transvaginal imaging could not be completed.  Probable 4 cm cervical mass is noted. Clinical correlation and direct visualization is recommended.   Electronically Signed  By: Marijo Conception, M.D.  On: 11/09/2014 20:03 2.Pathology report from [facility] has been reviewed 3.PET scan. IMPRESSION: Hypermetabolic mass in the uterine cervix, consistent with known primary cervical carcinoma.  No evidence of nodal or distant metastatic disease.  4 cm hypermetabolic left thyroid lobe nodule. Primary thyroid carcinoma cannot be excluded. Consider thyroid ultrasound and percutaneous needle aspiration for further evaluation. This follows ACR consensus guidelines: Managing Incidental Thyroid Nodules Detected on Imaging: White Paper of the ACR Incidental Thyroid Findings Committee. J Am Coll Radiol 2015; 12:143-150.   Electronically Signed  By: Earle Gell M.D.  On: 11/15/2014 11:36   ASSESSMENT:  Invasive adenocarcinoma of cervix FIGO stage 1 B2. PET scan also shows hypermetabolic thyroid nodule, 4 cm. Diarrhea and nausea secondary to radiation and chemotherapy Will get patient Sandostatin 200 g subcutaneous Patient will continue Lomotil and Imodium as needed Continue chemotherapy depending on the blood results Lab data will be reviewed as soon as is available Continue radiation therapy PLAN:   Patient has been evaluated by  radiation and GYN oncologist.   I discussed situation with Dr. Baruch Gouty, our radiation oncologist today Patient is going to start radiation therapy As per NCCN  criteria there might be an advantage of adding cis-platinum on a weekly basisx5 2.patient will need chemotherapy  class 3.  Patient is extremely emotional and will add to increased dose of Zoloft and long-acting and times anxiety medications like Klonopin If needed there is a chance with my to get psychiatric help 4.  Do not see any need for port placement as cis-platinum is only for 5 weeks and patient does have good venous access 5.  Thyroid nodule will be evaluated by ENT surgeon ./ again patient and number of questions.  Continue fifth and the last chemotherapy patient is finishing up radiation therapy.  Reevaluate patient in 6 weeks or before if there is anything major problem all the lab data has been reviewed Total duration of visit was 22mnutes.  50% or more time was spent in counseling patient and family regarding prognosis and options of treatment and available resources     Cervical cancer   Staging form: Cervix Uteri, AJCC 7th Edition     Clinical: FIGO Stage IB2 (T1b2, N0) - Signed by JForest Gleason MD on 12/19/2014   JForest Gleason MD   01/31/2015 9:48 AM

## 2015-02-01 ENCOUNTER — Emergency Department
Admission: EM | Admit: 2015-02-01 | Discharge: 2015-02-01 | Payer: 59 | Attending: Emergency Medicine | Admitting: Emergency Medicine

## 2015-02-01 ENCOUNTER — Encounter: Payer: Self-pay | Admitting: Emergency Medicine

## 2015-02-01 ENCOUNTER — Ambulatory Visit
Admission: RE | Admit: 2015-02-01 | Discharge: 2015-02-01 | Disposition: A | Discharge status: Home / Self Care | Payer: 59 | Source: Ambulatory Visit | Attending: Radiation Oncology | Admitting: Radiation Oncology

## 2015-02-01 DIAGNOSIS — Y658 Other specified misadventures during surgical and medical care: Secondary | ICD-10-CM | POA: Insufficient documentation

## 2015-02-01 DIAGNOSIS — T8131XA Disruption of external operation (surgical) wound, not elsewhere classified, initial encounter: Secondary | ICD-10-CM

## 2015-02-01 DIAGNOSIS — Z8541 Personal history of malignant neoplasm of cervix uteri: Secondary | ICD-10-CM | POA: Diagnosis not present

## 2015-02-01 DIAGNOSIS — R450 Nervousness: Secondary | ICD-10-CM | POA: Insufficient documentation

## 2015-02-01 DIAGNOSIS — Z90710 Acquired absence of both cervix and uterus: Secondary | ICD-10-CM | POA: Insufficient documentation

## 2015-02-01 DIAGNOSIS — Z79899 Other long term (current) drug therapy: Secondary | ICD-10-CM | POA: Diagnosis not present

## 2015-02-01 DIAGNOSIS — R32 Unspecified urinary incontinence: Secondary | ICD-10-CM | POA: Diagnosis not present

## 2015-02-01 DIAGNOSIS — Z87891 Personal history of nicotine dependence: Secondary | ICD-10-CM | POA: Insufficient documentation

## 2015-02-01 DIAGNOSIS — F419 Anxiety disorder, unspecified: Secondary | ICD-10-CM | POA: Diagnosis not present

## 2015-02-01 DIAGNOSIS — G8918 Other acute postprocedural pain: Secondary | ICD-10-CM | POA: Diagnosis present

## 2015-02-01 DIAGNOSIS — R159 Full incontinence of feces: Secondary | ICD-10-CM | POA: Insufficient documentation

## 2015-02-01 DIAGNOSIS — T8132XA Disruption of internal operation (surgical) wound, not elsewhere classified, initial encounter: Secondary | ICD-10-CM | POA: Diagnosis not present

## 2015-02-01 DIAGNOSIS — Z510 Encounter for antineoplastic radiation therapy: Secondary | ICD-10-CM | POA: Diagnosis not present

## 2015-02-01 LAB — CBC WITH DIFFERENTIAL/PLATELET
BASOS ABS: 0 10*3/uL (ref 0–0.1)
Basophils Relative: 0 %
EOS PCT: 0 %
Eosinophils Absolute: 0 10*3/uL (ref 0–0.7)
HEMATOCRIT: 32.7 % — AB (ref 35.0–47.0)
HEMOGLOBIN: 11.4 g/dL — AB (ref 12.0–16.0)
LYMPHS PCT: 2 %
Lymphs Abs: 0.2 10*3/uL — ABNORMAL LOW (ref 1.0–3.6)
MCH: 31.7 pg (ref 26.0–34.0)
MCHC: 34.7 g/dL (ref 32.0–36.0)
MCV: 91.3 fL (ref 80.0–100.0)
Monocytes Absolute: 0.9 10*3/uL (ref 0.2–0.9)
Monocytes Relative: 8 %
NEUTROS ABS: 10.7 10*3/uL — AB (ref 1.4–6.5)
NEUTROS PCT: 90 %
PLATELETS: 198 10*3/uL (ref 150–440)
RBC: 3.59 MIL/uL — AB (ref 3.80–5.20)
RDW: 13.5 % (ref 11.5–14.5)
WBC: 11.8 10*3/uL — AB (ref 3.6–11.0)

## 2015-02-01 LAB — BASIC METABOLIC PANEL
ANION GAP: 8 (ref 5–15)
BUN: 29 mg/dL — ABNORMAL HIGH (ref 6–20)
CHLORIDE: 109 mmol/L (ref 101–111)
CO2: 22 mmol/L (ref 22–32)
Calcium: 8.2 mg/dL — ABNORMAL LOW (ref 8.9–10.3)
Creatinine, Ser: 0.57 mg/dL (ref 0.44–1.00)
Glucose, Bld: 106 mg/dL — ABNORMAL HIGH (ref 65–99)
POTASSIUM: 3 mmol/L — AB (ref 3.5–5.1)
SODIUM: 139 mmol/L (ref 135–145)

## 2015-02-01 MED ORDER — LORAZEPAM 2 MG/ML IJ SOLN
INTRAMUSCULAR | Status: AC
  Administered 2015-02-01: 0.5 mg
  Filled 2015-02-01: qty 1

## 2015-02-01 MED ORDER — LORAZEPAM 2 MG/ML IJ SOLN
0.5000 mg | Freq: Once | INTRAMUSCULAR | Status: AC
  Administered 2015-02-01: 0.5 mg via INTRAVENOUS

## 2015-02-01 MED ORDER — LORAZEPAM 2 MG/ML IJ SOLN
INTRAMUSCULAR | Status: AC
  Administered 2015-02-01: 0.5 mg via INTRAVENOUS
  Filled 2015-02-01: qty 1

## 2015-02-01 NOTE — ED Notes (Signed)
Pt arrived from external radiation treatment. 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

## 2015-02-01 NOTE — ED Provider Notes (Signed)
[facility] Emergency Department Provider Note  ____________________________________________  Time seen: 1745  I have reviewed the triage vital signs and the nursing notes.   HISTORY  Chief Complaint Post-op Problem     HPI Kerby Hockley is a 49 y.o. female who was sent to the emergency Department due to a complication of having bowel protruding from her vagina. She is approximately 2-1/2 months status post hysterectomy due to cervical cancer. Due to the cervical cancer she received chemotherapy and radiation treatment. The patient tells me she went to get radiation treatment today in the replacing a capsule into the vagina and this led to a perforation. Radiation oncology, Dr. Donella Stade, sent the patient to gynecology, Dr. Ouida Sills. Dr. Ouida Sills apparently placed packing in the vagina and sent the patient to the emergency department.  Thepatient denies any abdominal pain or discomfort. She is nervous and reports she has a history of anxiety.    Past Medical History  Diagnosis Date  . Anxiety   . Cervical cancer [facility])     Radical Hysterectomy 11/26/14 with adjuvant chemoradiation    Patient Active Problem List   Diagnosis Date Noted  . Anxiety 12/03/2014  . Lump in thyroid 11/25/2014  . Cervical cancer ([place]) 11/17/2014    Past Surgical History  Procedure Laterality Date  . Radical hysterectomy  11/26/2014    Stage IB2 Cervical Cancer, adjuvant chemoradiation    Current Outpatient Rx  Name  Route  Sig  Dispense  Refill  . ALPRAZolam (XANAX) 1 MG tablet      TK 1 T PO Q NIGHT PRN FOR SLEEP      0   . Ascorbic Acid (VITAMIN C) 1000 MG tablet   Oral   Take 1,000 mg by mouth 2 times daily at 12 noon and 4 pm.         . clonazePAM (KLONOPIN) 0.5 MG tablet            1   . diphenoxylate-atropine (LOMOTIL) 2.5-0.025 MG tablet   Oral   Take 1 tablet by mouth 4 (four) times daily as needed for diarrhea or loose stools.   30  tablet   0   . Multiple Vitamins-Iron (MULTIVITAMINS WITH IRON) TABS tablet   Oral   Take 1 tablet by mouth daily.         . Multiple Vitamins-Minerals (MULTIVITAMIN WITH MINERALS) tablet   Oral   Take 1 tablet by mouth daily.         . nicotine (NICODERM CQ - DOSED IN MG/24 HOURS) 21 mg/24hr patch   Transdermal   Place 1 patch onto the skin daily. Place onto skin         . ondansetron (ZOFRAN) 4 MG tablet      Take 1-2 tabs PO every 4 hours PRN for nausea, vomiting   60 tablet   3   . promethazine (PHENERGAN) 25 MG tablet   Oral   Take 1 tablet (25 mg total) by mouth every 6 (six) hours as needed for nausea or vomiting.   30 tablet   3   . sertraline (ZOLOFT) 50 MG tablet   Oral   Take 1 tablet (50 mg total) by mouth at bedtime.   30 tablet   3   . traZODone (DESYREL) 50 MG tablet      TK 1 T PO QD      1   . vitamin B-12 (CYANOCOBALAMIN) 100 MCG tablet   Oral   Take  100 mcg by mouth daily.           Allergies Codeine  History reviewed. No pertinent family history.  Social History Social History  Substance Use Topics  . Smoking status: Former Smoker -- 1.00 packs/day for 20 years    Types: Cigarettes    Quit date: 11/01/2014  . Smokeless tobacco: Never Used  . Alcohol Use: No    Review of Systems  Constitutional: Negative for fever. ENT: Negative for sore throat. Cardiovascular: Negative for chest pain. Respiratory: Negative for cough. Gastrointestinal: Negative for abdominal pain, vomiting and diarrhea. Genitourinary: Recent hysterectomy due to cervical cancer. In the emergency Department due to a vaginal perforation with bowel protruding. See history of present illness Musculoskeletal: No myalgias or injuries. Skin: Negative for rash. Neurological: Negative for paresthesia or weakness   10-point ROS otherwise negative.  ____________________________________________   PHYSICAL EXAM:  VITAL SIGNS: ED Triage Vitals  Enc Vitals  Group     BP 02/01/15 1736 113/55 mmHg     Pulse Rate 02/01/15 1736 62     Resp 02/01/15 1736 23     Temp 02/01/15 1736 97.8 F (36.6 C)     Temp Source 02/01/15 1736 Oral     SpO2 02/01/15 1736 100 %     Weight 02/01/15 1737 123 lb (55.792 kg)     Height 02/01/15 1737 5\' 5"  (1.651 m)     Head Cir --      Peak Flow --      Pain Score 02/01/15 1742 0     Pain Loc --      Pain Edu? --      Excl. in [place]? --     Constitutional:  Alert and oriented. Nervous, but no acute distress Head: Normocephalic and atraumatic. Cardiovascular: Normal rate, regular rhythm, no murmur noted Respiratory:  Normal respiratory effort, no tachypnea.    Breath sounds are clear and equal bilaterally.  Gastrointestinal: Soft and nontender. No distention.  Genitourinary: Normal external female genitalia. Spreading of the labia reveals packing material present. No blood noted. No further exam attempted.. Musculoskeletal: No deformity noted. Nontender with normal range of motion in all extremities.  No noted edema. Neurologic: Nervous but intact cognition and communication. No gross focal neurologic deficits are appreciated.  Skin:  Skin is warm, dry. No rash noted. Psychiatric: Nervous, anxious, but communicative and overall appropriate..  ____________________________________________    LABS (pertinent positives/negatives)  ED labs.  ____________________________________________  EKG  ED ECG REPORT I, Kristapher [place] W, the attending physician, personally viewed and interpreted this ECG.   Date: 02/01/2015  EKG Time: 1739  Rate: 64  Rhythm: Normal sinus rhythm  Axis: Normal  Intervals: Normal  ST&T Change: None noted   ____________________________________________   INITIAL IMPRESSION / ASSESSMENT AND PLAN / ED COURSE  Pertinent labs & imaging results that were available during my care of the patient were reviewed by me and considered in my medical decision making (see chart for  details).  49 year old female status post recent hysterectomy now with vaginal perforation with bowel protruding, per report from gynecology, who has packed the vaginal vault.  I have called general surgery, Dr. Burt Knack, to discuss further care for this patient. He believes that this patient needs specialty care for this problem in the pelvic floor. The patient did have her hysterectomy at [facility] and we will call their to seek additional help and transfer.  ----------------------------------------- 6:50 PM on 02/01/2015 -----------------------------------------  I have spoke with Dr. Ouida Sills, gynecology. He  agrees with the transfer to [facility]. I spoke with Dr. Drue Flirt at [facility]. He accepts the patient in transfer  to [facility]. We discussed the option of antibiotics at this time, which she said was not necessary. I discussed the transfer with the patient and her family.  ____________________________________________   FINAL CLINICAL IMPRESSION(S) / ED DIAGNOSES  Final diagnoses:  Vaginal cuff dehiscence, initial encounter  Bowel and bladder incontinence   protrusion of bowel from vagina     Ahmed Prima, MD 02/01/15 2245

## 2015-02-01 NOTE — ED Notes (Addendum)
Pt alert and oriented NSR on monitor. Pt waiting on transfer to [facility].

## 2015-02-01 NOTE — ED Notes (Addendum)
Pt sent from obgyn for repair of prolapse of small bowel into vagina. Pt anxious and tearful during assessment. Dr Thomasene Lot at bedside.

## 2015-02-01 NOTE — ED Notes (Signed)
Dr. Thomasene Lot at bedside

## 2015-02-01 NOTE — ED Notes (Signed)
Removed 4 IVs that were still in system from prior encounters.

## 2015-02-01 NOTE — ED Notes (Signed)
Pt informed that we need urine sample. She does not need to go at this point and refused to consider catheterization. Pt told she can go at her convenience.

## 2015-02-01 NOTE — ED Notes (Signed)
Family at bedside. 

## 2015-02-02 ENCOUNTER — Ambulatory Visit: Payer: 59

## 2015-02-03 ENCOUNTER — Ambulatory Visit: Payer: 59

## 2015-02-04 ENCOUNTER — Ambulatory Visit: Payer: 59

## 2015-02-07 ENCOUNTER — Ambulatory Visit: Payer: 59

## 2015-02-08 ENCOUNTER — Ambulatory Visit: Payer: 59

## 2015-02-16 ENCOUNTER — Ambulatory Visit: Payer: 59

## 2015-02-18 ENCOUNTER — Ambulatory Visit: Payer: 59

## 2015-02-21 ENCOUNTER — Ambulatory Visit: Payer: 59

## 2015-02-23 ENCOUNTER — Inpatient Hospital Stay: Payer: 59 | Attending: Obstetrics and Gynecology | Admitting: Obstetrics and Gynecology

## 2015-02-23 ENCOUNTER — Ambulatory Visit: Admission: RE | Admit: 2015-02-23 | Payer: 59 | Source: Ambulatory Visit | Admitting: Radiation Oncology

## 2015-02-23 ENCOUNTER — Encounter: Payer: Self-pay | Admitting: Obstetrics and Gynecology

## 2015-02-23 VITALS — BP 119/74 | HR 85 | Temp 98.1°F | Resp 18 | Wt 123.9 lb

## 2015-02-23 DIAGNOSIS — Z90722 Acquired absence of ovaries, bilateral: Secondary | ICD-10-CM

## 2015-02-23 DIAGNOSIS — C539 Malignant neoplasm of cervix uteri, unspecified: Secondary | ICD-10-CM | POA: Diagnosis not present

## 2015-02-23 DIAGNOSIS — F419 Anxiety disorder, unspecified: Secondary | ICD-10-CM | POA: Diagnosis not present

## 2015-02-23 DIAGNOSIS — Z9221 Personal history of antineoplastic chemotherapy: Secondary | ICD-10-CM | POA: Diagnosis not present

## 2015-02-23 DIAGNOSIS — Z923 Personal history of irradiation: Secondary | ICD-10-CM | POA: Diagnosis not present

## 2015-02-23 DIAGNOSIS — Z90710 Acquired absence of both cervix and uterus: Secondary | ICD-10-CM | POA: Diagnosis not present

## 2015-02-23 DIAGNOSIS — Z79899 Other long term (current) drug therapy: Secondary | ICD-10-CM | POA: Insufficient documentation

## 2015-02-23 DIAGNOSIS — F17210 Nicotine dependence, cigarettes, uncomplicated: Secondary | ICD-10-CM | POA: Diagnosis not present
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
## 2015-02-23 NOTE — Patient Instructions (Signed)
Cervical Cancer The cervix is the opening and bottom part of the uterus between the vagina and the uterus. Cervical cancer is a fairly common cancer. It occurs most often in women between the ages of 48 years and 70 years. Cells of the cervix act very much like skin cells. These cells are exposed to toxins, viruses, and bacteria that may cause abnormal changes.  There are two kinds of cancers of the cervix:   Squamous cell carcinoma. This type of cancer starts in the flat or scale-like cells that line the cervix. Squamous cell carcinoma can develop from a sexually transmitted infection caused by the human papillomavirus (HPV).  Adenocarcinoma. This type of cervical cancer starts in glandular cells that line the cervix. RISK FACTORS The risk of getting cancer of the cervix is related to your lifestyle, sexual history, health, and immune system. Risks for cervical cancer include:   Having a sexually transmitted viral infection. These include:  Chlamydia.   Herpes.   HPV.  Becoming sexually active before age 58 years.  Having more than one sexual partner or having sex with someone who has more than one sexual partner.  Not using condoms with sexual partners.  Having had cancer of the vagina or vulva.  Having a sexual partner who has or had cancer of the penis or who has had a sexual partner with abnormal cervical cells (dysplasia) or cervical cancer.  Using oral contraceptives (also called birth control pills).  Smoking.   Having a weakened immune system. For example, human immunodeficiency virus (HIV) or other immune deficiency disorders.  Being the daughter of a woman who took diethylstilbestrol (DES) during pregnancy.  Having a sister or mother who has had cancer of the cervix.  Being [place] American, Hispanic, Asian, or a woman from the [place].  A history of dysplasia of the cervix. SIGNS AND SYMPTOMS  Symptoms are usually not present in the early stages of  cervical cancer. Once the cancer invades the cervix and surrounding tissues, the woman may have:   Abnormal vaginal bleeding or menstrual bleeding that is longer or heavier than usual.  Bleeding after intercourse, douching, or a Pap test.  Vaginal bleeding following menopause.  Abnormal vaginal discharge.  Pelvic discomfort or pain.  An abnormal Pap test.  Pain during sexual intercourse. Symptoms of more advanced cervical cancer may include:   Loss of appetite or weight loss.  Tiredness (fatigue).  Back and leg pain.  Inability to control urination or bowel movements. There are different stages of cervical cancer:  You had Stage IB - this means the tumor is in the uterus and cervix only.  TREATMENT   You had a radical hysterectomy with removal of the uterus, cervix, upper vagina, lymph nodes, and surrounding tissue   You also had a combination of radiation, and chemotherapy.   HOME CARE INSTRUCTIONS   Get a gynecology exam as directed by your health care provider.   Do not smoke.  Do not have sexual intercourse until your health care provider says it is okay.  SEEK MEDICAL CARE IF:   You have increased pelvic pain or pressure.   Your are becoming increasingly tired.   You have increased leg or back pain.   You have a fever.  You have abnormal bleeding or discharge.  You lose weight. SEEK IMMEDIATE MEDICAL CARE IF:   You cannot urinate.  You have blood in your urine.   You have blood or pressure with a bowel movement.   You  develop severe back, stomach, or pelvic pain.   This information is not intended to replace advice given to you by your health care provider. Make sure you discuss any questions you have with your health care provider.   Document Released: 03/19/2005 Document Revised: 03/24/2013 Document Reviewed: 09/10/2012 Elsevier Interactive Patient Education [employer].   I recommended continued close follow up with exams,  including pelvic exams every 3-6 months for 2 years, then every 6-12 months for 3-5 years and then annually thereafter. Cervical/vaginal (Pap smears) cytology may be considered annually, but is thought to have limited value for detecting recurrent cervical cancer. Imaging and laboratory assessment is based on clinical symptoms or findings. Continue to have an active lifestyle, exercise, good nutrition. I am so happy you were able to stop smoking; please do not restart. Sexual health can be an issue with the vagina being shortened by surgery. Sometimes women use vaginal dilators to keep the vagina open. Please do not have sexual intercourse and place anything in the vagina until we ensure the vaginal cuff has completely.   Happy Thanksgiving!

## 2015-02-23 NOTE — Progress Notes (Signed)
Patient here to see Dr. Theora Gianotti for follow up.

## 2015-03-14 ENCOUNTER — Ambulatory Visit: Payer: 59 | Admitting: Oncology

## 2015-03-14 ENCOUNTER — Other Ambulatory Visit: Payer: 59

## 2015-03-30 ENCOUNTER — Ambulatory Visit: Payer: 59

## 2015-04-05 ENCOUNTER — Inpatient Hospital Stay: Payer: 59 | Attending: Oncology

## 2015-04-05 NOTE — Progress Notes (Unsigned)
Survivorship Care Plan completed.  Treatment summary reviewed and given to patient.  ASCO answers reviewed and given to patient.  CARE program and Cancer Transitions discussed and other resources available through the cancer center.  Patient oriented to [facility] to Recovery program.

## 2015-05-04 ENCOUNTER — Inpatient Hospital Stay: Payer: 59 | Attending: Oncology

## 2015-05-04 ENCOUNTER — Encounter: Payer: Self-pay | Admitting: Obstetrics and Gynecology

## 2015-05-04 VITALS — BP 130/71 | HR 71 | Temp 97.2°F | Resp 18 | Ht 65.0 in | Wt 135.1 lb

## 2015-05-04 DIAGNOSIS — C539 Malignant neoplasm of cervix uteri, unspecified: Secondary | ICD-10-CM

## 2015-05-04 NOTE — Progress Notes (Signed)
Assited MD with pelvic exam. 

## 2015-05-04 NOTE — Progress Notes (Signed)
Pt here for check up from her vaginal cuff repair. No concerns at this time. No pain
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
## 2015-06-06 ENCOUNTER — Inpatient Hospital Stay: Payer: 59 | Attending: Oncology | Admitting: Oncology

## 2015-06-06 ENCOUNTER — Inpatient Hospital Stay: Payer: 59

## 2015-06-06 ENCOUNTER — Encounter: Payer: Self-pay | Admitting: Oncology

## 2015-06-06 VITALS — BP 143/81 | HR 57 | Temp 96.3°F | Resp 18 | Wt 137.2 lb

## 2015-06-06 DIAGNOSIS — Z8541 Personal history of malignant neoplasm of cervix uteri: Secondary | ICD-10-CM | POA: Diagnosis not present

## 2015-06-06 DIAGNOSIS — F419 Anxiety disorder, unspecified: Secondary | ICD-10-CM | POA: Insufficient documentation

## 2015-06-06 DIAGNOSIS — Z9221 Personal history of antineoplastic chemotherapy: Secondary | ICD-10-CM | POA: Diagnosis not present

## 2015-06-06 DIAGNOSIS — Z90710 Acquired absence of both cervix and uterus: Secondary | ICD-10-CM | POA: Insufficient documentation

## 2015-06-06 DIAGNOSIS — E041 Nontoxic single thyroid nodule: Secondary | ICD-10-CM | POA: Diagnosis not present

## 2015-06-06 DIAGNOSIS — Z79899 Other long term (current) drug therapy: Secondary | ICD-10-CM | POA: Diagnosis not present

## 2015-06-06 DIAGNOSIS — Z923 Personal history of irradiation: Secondary | ICD-10-CM | POA: Diagnosis not present

## 2015-06-06 DIAGNOSIS — C539 Malignant neoplasm of cervix uteri, unspecified: Secondary | ICD-10-CM

## 2015-06-06 DIAGNOSIS — Z87891 Personal history of nicotine dependence: Secondary | ICD-10-CM | POA: Diagnosis not present

## 2015-06-06 DIAGNOSIS — Z90722 Acquired absence of ovaries, bilateral: Secondary | ICD-10-CM | POA: Insufficient documentation

## 2015-06-06 LAB — COMPREHENSIVE METABOLIC PANEL
ALT: 13 U/L — AB (ref 14–54)
AST: 15 U/L (ref 15–41)
Albumin: 3.9 g/dL (ref 3.5–5.0)
Alkaline Phosphatase: 52 U/L (ref 38–126)
Anion gap: 6 (ref 5–15)
BUN: 33 mg/dL — ABNORMAL HIGH (ref 6–20)
CHLORIDE: 103 mmol/L (ref 101–111)
CO2: 25 mmol/L (ref 22–32)
Calcium: 9.2 mg/dL (ref 8.9–10.3)
Creatinine, Ser: 0.81 mg/dL (ref 0.44–1.00)
Glucose, Bld: 102 mg/dL — ABNORMAL HIGH (ref 65–99)
POTASSIUM: 4.3 mmol/L (ref 3.5–5.1)
SODIUM: 134 mmol/L — AB (ref 135–145)
Total Bilirubin: 0.5 mg/dL (ref 0.3–1.2)
Total Protein: 7.3 g/dL (ref 6.5–8.1)

## 2015-06-06 LAB — CBC WITH DIFFERENTIAL/PLATELET
Basophils Absolute: 0 10*3/uL (ref 0–0.1)
Basophils Relative: 1 %
Eosinophils Absolute: 0.1 10*3/uL (ref 0–0.7)
Eosinophils Relative: 2 %
HEMATOCRIT: 39.9 % (ref 35.0–47.0)
Hemoglobin: 13.6 g/dL (ref 12.0–16.0)
LYMPHS ABS: 0.7 10*3/uL — AB (ref 1.0–3.6)
LYMPHS PCT: 12 %
MCH: 31.5 pg (ref 26.0–34.0)
MCHC: 34.2 g/dL (ref 32.0–36.0)
MCV: 92 fL (ref 80.0–100.0)
MONOS PCT: 8 %
Monocytes Absolute: 0.5 10*3/uL (ref 0.2–0.9)
NEUTROS ABS: 4.7 10*3/uL (ref 1.4–6.5)
NEUTROS PCT: 77 %
Platelets: 232 10*3/uL (ref 150–440)
RBC: 4.34 MIL/uL (ref 3.80–5.20)
RDW: 11.8 % (ref 11.5–14.5)
WBC: 6 10*3/uL (ref 3.6–11.0)

## 2015-06-06 NOTE — Progress Notes (Signed)
[place] @ [facility] [phone]  Fax:([phone]) [place] OB: [birth year]  MR#: [id number]  YPP#:[id number]  Patient Care Team: Pcp Not In System as PCP - General  CHIEF COMPLAINT:  Chief Complaint  Patient presents with  . Follow-up    Malignant neoplasm of cervix  invasive adenocarcinoma of cervix FIGO staging T1B2 N0M0 STATUS POST BILATERAL SALPINGO-OOPHORECTOMY AND HYST[facility]CTOMY AT [facility].  Deanna Haynes OF 2016. Patient starting radiation and chemotherapy with cis-platinum. It is chemotherapy and external beam radiation therapy in October of 2016. During brachytherapy patient had perforation and ended up in the hospital at [facility] for surgical intervention (vaginal cuff repair)  VISIT DIAGNOSIS:   Carcinoma of cervix      INTERVAL HISTORY:51 year old lady who is extremely emotional and has been accompanied by a friend.  Patient was diagnosed to have carcinoma of cervix.  Patient had invasive adenocarcinoma of cervix which has been staged as stage IB.  (for detailedl information please see initial staging workup)  She   has finished all chemotherapy and also finished radiation treatment patient had perforation after attempted intracavitary treatment and had a vaginal cuff repair. No abdominal pain no nausea no vomiting patient is gradually recovering back to the work. Patient is currently followed by gynecologist and another mammogram will be scheduled by her.  REVIEW OF SYSTEMS:   GENERAL:  Feels good.  Active.  No fevers, sweats or weight loss. Very emotional crying PERFORMANCE STATUS (ECOG):  0 HEENT:  No visual changes, runny nose, sore throat, mouth sores or tenderness. Lungs: No shortness of breath or cough.  No hemoptysis. Cardiac:  No chest pain, palpitations, orthopnea, or PND. GI:  As described in history of present illness GU:  No urgency, frequency, dysuria, or hematuria. Musculoskeletal:  No back  pain.  No joint pain.  No muscle tenderness. Extremities:  No pain or swelling. Skin:  No rashes or skin changes. Neuro:  No headache, numbness or weakness, balance or coordination issues. Endocrine:  No diabetes, thyroid issues, hot flashes or night sweats. Psych:  Depressed and extremely anxious Pain:  No focal pain. Review of systems:  All other systems reviewed and found to be negative.  As per HPI. Otherwise, a complete review of systems is negatve.  PAST MEDICAL HISTORY: Past Medical History  Diagnosis Date  . Anxiety   . Cervical cancer [facility])     Radical Hysterectomy 11/26/14 with adjuvant chemoradiation  . Vaginal cuff dehiscence     PAST SURGICAL HISTORY: Recent surgery for carcinoma of cervix No other surgical history  FAMILY HISTORY There is no significant family history of breast cancer, ovarian cancer, colon cancer       ADVANCED DIRECTIVES:   Patient does not have any living will or healthcare power of attorney.  Information was given .  Available resources had been discussed.  We will follow-up on subsequent appointments regarding this issue HEALTH MAINTENANCE: Social History  Substance Use Topics  . Smoking status: Former Smoker -- 1.00 packs/day for 20 years    Types: Cigarettes    Quit date: 11/01/2014  . Smokeless tobacco: Never Used  . Alcohol Use: No    el:  Allergies  Allergen Reactions  . Codeine Nausea And Vomiting    Current Outpatient Prescriptions  Medication Sig Dispense Refill  . clonazePAM (KLONOPIN) 0.5 MG tablet Take 0.5 mg by mouth daily.    . diphenoxylate-atropine (LOMOTIL) 2.5-0.025 MG tablet Take 1 tablet by mouth 4 (four)  times daily as needed for diarrhea or loose stools. 30 tablet 0  . ferrous sulfate 325 (65 FE) MG tablet Take 325 mg by mouth daily with breakfast.    . Multiple Vitamins-Minerals (MULTIVITAMIN WITH MINERALS) tablet Take 1 tablet by mouth daily.    . nicotine (NICODERM CQ - DOSED IN MG/24 HOURS) 14 mg/24hr  patch Place 14 mg onto the skin daily.    . ondansetron (ZOFRAN) 4 MG tablet Take 4-8 mg by mouth every 4 (four) hours as needed for nausea or vomiting.     . promethazine (PHENERGAN) 25 MG tablet Take 1 tablet (25 mg total) by mouth every 6 (six) hours as needed for nausea or vomiting. 30 tablet 3  . sertraline (ZOLOFT) 25 MG tablet Take 25 mg by mouth daily.    . traZODone (DESYREL) 50 MG tablet Take 50 mg by mouth at bedtime.    . vitamin B-12 (CYANOCOBALAMIN) 100 MCG tablet Take 100 mcg by mouth daily.    . vitamin C (ASCORBIC ACID) 500 MG tablet Take 500 mg by mouth 2 (two) times daily.     No current facility-administered medications for this visit.    OBJECTIVE: PHYSICAL EXAM: GENERAL:  Well developed, well nourished, sitting comfortably in the exam room in no acute distress. MENTAL STATUS:  Alert and oriented to person, place and time. Extremely apprehensive and agitated. t.  RESPIRATORY:  Clear to auscultation without rales, wheezes or rhonchi. CARDIOVASCULAR:  Regular rate and rhythm without murmur, rub or gallop.  ABDOMEN: Slight tenderness in abdominal area.  Liver and spleen not palpable.  Hyperactive bowel sounds BACK:  No CVA tenderness.  No tenderness on percussion of the back or rib cage. SKIN:  No rashes, ulcers or lesions. EXTREMITIES: No edema, no skin discoloration or tenderness.  No palpable cords. LYMPH NODES: No palpable cervical, supraclavicular, axillary or inguinal adenopathy  NEUROLOGICAL: Unremarkable. PSYCH:  Appropriate.  Filed Vitals:   06/06/15 1056  BP: 143/81  Pulse: 57  Temp: 96.3 F (35.7 C)  Resp: 18     Body mass index is 22.83 kg/(m^2).    ECOG FS:0 - Asymptomatic  LAB RESULTS:  Appointment on 06/06/2015  Component Date Value Ref Range Status  . Sodium 06/06/2015 134* 135 - 145 mmol/L Final  . Potassium 06/06/2015 4.3  3.5 - 5.1 mmol/L Final  . Chloride 06/06/2015 103  101 - 111 mmol/L Final  . CO2 06/06/2015 25  22 - 32 mmol/L Final   . Glucose, Bld 06/06/2015 102* 65 - 99 mg/dL Final  . BUN 06/06/2015 33* 6 - 20 mg/dL Final  . Creatinine, Ser 06/06/2015 0.81  0.44 - 1.00 mg/dL Final  . Calcium 06/06/2015 9.2  8.9 - 10.3 mg/dL Final  . Total Protein 06/06/2015 7.3  6.5 - 8.1 g/dL Final  . Albumin 06/06/2015 3.9  3.5 - 5.0 g/dL Final  . AST 06/06/2015 15  15 - 41 U/L Final  . ALT 06/06/2015 13* 14 - 54 U/L Final  . Alkaline Phosphatase 06/06/2015 52  38 - 126 U/L Final  . Total Bilirubin 06/06/2015 0.5  0.3 - 1.2 mg/dL Final  . GFR calc non Af Amer 06/06/2015 >60  >60 mL/min Final  . GFR calc Af Amer 06/06/2015 >60  >60 mL/min Final   Comment: (NOTE) The eGFR has been calculated using the CKD EPI equation. This calculation has not been validated in all clinical situations. eGFR's persistently <60 mL/min signify possible Chronic Kidney Disease.   Georgiann Hahn gap 06/06/2015  6  5 - 15 Final  . WBC 06/06/2015 6.0  3.6 - 11.0 K/uL Final  . RBC 06/06/2015 4.34  3.80 - 5.20 MIL/uL Final  . Hemoglobin 06/06/2015 13.6  12.0 - 16.0 g/dL Final  . HCT 06/06/2015 39.9  35.0 - 47.0 % Final  . MCV 06/06/2015 92.0  80.0 - 100.0 fL Final  . MCH 06/06/2015 31.5  26.0 - 34.0 pg Final  . MCHC 06/06/2015 34.2  32.0 - 36.0 g/dL Final  . RDW 06/06/2015 11.8  11.5 - 14.5 % Final  . Platelets 06/06/2015 232  150 - 440 K/uL Final  . Neutrophils Relative % 06/06/2015 77   Final  . Neutro Abs 06/06/2015 4.7  1.4 - 6.5 K/uL Final  . Lymphocytes Relative 06/06/2015 12   Final  . Lymphs Abs 06/06/2015 0.7* 1.0 - 3.6 K/uL Final  . Monocytes Relative 06/06/2015 8   Final  . Monocytes Absolute 06/06/2015 0.5  0.2 - 0.9 K/uL Final  . Eosinophils Relative 06/06/2015 2   Final  . Eosinophils Absolute 06/06/2015 0.1  0 - 0.7 K/uL Final  . Basophils Relative 06/06/2015 1   Final  . Basophils Absolute 06/06/2015 0.0  0 - 0.1 K/uL Final   lab result   From  [facility]  WBC 9.4. Hemoglobin 14.3.  Platelet count is  12.9.   STUDIES:   Electronically Signed  By: Earle Gell M.D.  On: 11/15/2014 11:36   ASSESSMENT:  Invasive adenocarcinoma of cervix FIGO stage 1 B2. Status post chemoradiation therapy. Status post vaginal cuff repair (perforation at the time of attempted intracavitary treatment)  5.  Thyroid nodule will be evaluated by ENT surgeon ./ All lab data has been reviewed.  On clinical examination there is no evidence of recurrent or progressive disease. Patient is going to be seen by GYN oncologist.  And being evaluated by Dr. Leafy Ro general gynecologist. A repeat CT scan would be schedule prior to next appointment.  Patient would be discharged from my care to be followed by GYN oncology second gynecologist will arrange for regular mammogram. She has been discharged from my care    Cervical cancer   Staging form: Cervix Uteri, AJCC 7th Edition     Clinical: FIGO Stage IB2 (T1b2, N0) - Signed by Forest Gleason, MD on 12/19/2014   Forest Gleason, MD   06/06/2015 2:15 PM

## 2015-06-14 ENCOUNTER — Telehealth: Payer: Self-pay

## 2015-06-14 NOTE — Telephone Encounter (Signed)
  Oncology Nurse Navigator Documentation  Navigator Location: [facility] (06/14/15 1400) Navigator Encounter Type: Telephone (06/14/15 1400) Telephone: Symptom Mgt (06/14/15 1400)     I received a call from this patient. She was last seen 05/04/15. She has increased her activity back to normal which includes riding her horses several times per week. She had a small area that was not quite healed last visit. Over the last week on two separate occasions she has noticed just a very small amount of pink on the toilet paper when she wipes. She stressed that it was not red and it was very very little. Denies any other symptoms. She is just worried due to her cancer history and wanted to check and make sure she doesn't need to see anyone regarding it. Message sent to Dr Theora Gianotti                                     Time Spent with Patient: 15 (06/14/15 1400)

## 2015-06-16 ENCOUNTER — Telehealth: Payer: Self-pay

## 2015-06-16 NOTE — Telephone Encounter (Signed)
  Oncology Nurse Navigator Documentation  Navigator Location: [facility] (06/16/15 1500) Navigator Encounter Type: Telephone (06/16/15 1500) Telephone: Symptom Mgt (06/16/15 1500)                                        Time Spent with Patient: 15 (06/16/15 1500)   Asking speaking with Dr Theora Gianotti. Pt can be seen 3/22 for previous documented symptoms

## 2015-06-20 ENCOUNTER — Telehealth: Payer: Self-pay

## 2015-06-20 NOTE — Telephone Encounter (Signed)
  Oncology Nurse Navigator Documentation  Navigator Location: [facility] (06/20/15 1500) Navigator Encounter Type: Telephone (06/20/15 1500)                                          Time Spent with Patient: 15 (06/20/15 1500)   Received call from patient. She feels she over-reacted and wants to cancel appt for 3/22. She does not feel she can handle pelvic exam at present. She will continue to monitor the pink spot on toilet tissue occassionally and will reschedule if it continues over the next 2 weeks.

## 2015-06-22 ENCOUNTER — Inpatient Hospital Stay: Payer: 59

## 2015-08-01 ENCOUNTER — Ambulatory Visit
Admission: RE | Admit: 2015-08-01 | Discharge: 2015-08-01 | Disposition: A | Discharge status: Home / Self Care | Payer: 59 | Source: Ambulatory Visit | Attending: Oncology | Admitting: Oncology

## 2015-08-01 DIAGNOSIS — N200 Calculus of kidney: Secondary | ICD-10-CM | POA: Insufficient documentation

## 2015-08-01 DIAGNOSIS — Z90710 Acquired absence of both cervix and uterus: Secondary | ICD-10-CM | POA: Insufficient documentation

## 2015-08-01 DIAGNOSIS — C539 Malignant neoplasm of cervix uteri, unspecified: Secondary | ICD-10-CM | POA: Diagnosis not present

## 2015-08-01 MED ORDER — IOPAMIDOL (ISOVUE-300) INJECTION 61%
100.0000 mL | Freq: Once | INTRAVENOUS | Status: AC | PRN
  Administered 2015-08-01: 100 mL via INTRAVENOUS

## 2015-08-03 ENCOUNTER — Inpatient Hospital Stay: Payer: 59

## 2015-08-17 ENCOUNTER — Inpatient Hospital Stay: Payer: 59 | Attending: Obstetrics and Gynecology | Admitting: Obstetrics and Gynecology

## 2015-08-17 ENCOUNTER — Encounter: Payer: Self-pay | Admitting: Obstetrics and Gynecology

## 2015-08-17 VITALS — BP 127/78 | HR 73 | Temp 98.6°F | Ht 65.0 in | Wt 140.9 lb

## 2015-08-17 DIAGNOSIS — Z90722 Acquired absence of ovaries, bilateral: Secondary | ICD-10-CM | POA: Diagnosis not present

## 2015-08-17 DIAGNOSIS — Z9221 Personal history of antineoplastic chemotherapy: Secondary | ICD-10-CM

## 2015-08-17 DIAGNOSIS — Z923 Personal history of irradiation: Secondary | ICD-10-CM | POA: Diagnosis not present

## 2015-08-17 DIAGNOSIS — F17210 Nicotine dependence, cigarettes, uncomplicated: Secondary | ICD-10-CM | POA: Diagnosis not present

## 2015-08-17 DIAGNOSIS — Z90710 Acquired absence of both cervix and uterus: Secondary | ICD-10-CM | POA: Diagnosis not present

## 2015-08-17 DIAGNOSIS — Z79899 Other long term (current) drug therapy: Secondary | ICD-10-CM | POA: Diagnosis not present

## 2015-08-17 DIAGNOSIS — C539 Malignant neoplasm of cervix uteri, unspecified: Secondary | ICD-10-CM

## 2015-08-17 DIAGNOSIS — F419 Anxiety disorder, unspecified: Secondary | ICD-10-CM | POA: Diagnosis not present

## 2015-08-17 DIAGNOSIS — Z8541 Personal history of malignant neoplasm of cervix uteri: Secondary | ICD-10-CM | POA: Insufficient documentation

## 2015-08-17 NOTE — Progress Notes (Signed)
Assisted MD with pelvic exam

## 2015-08-17 NOTE — Patient Instructions (Signed)
Please schedule appointment in 3 months with Dr. Leafy Ro
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
## 2015-08-23 ENCOUNTER — Telehealth: Payer: Self-pay

## 2015-08-23 NOTE — Progress Notes (Signed)
  Oncology Nurse Navigator Documentation                                                   Opened in error

## 2015-08-23 NOTE — Telephone Encounter (Signed)
  Oncology Nurse Navigator Documentation                                                  opened in error

## 2016-02-15 ENCOUNTER — Inpatient Hospital Stay: Payer: 59 | Attending: Obstetrics and Gynecology | Admitting: Obstetrics and Gynecology

## 2016-02-15 ENCOUNTER — Other Ambulatory Visit: Payer: Self-pay | Admitting: Obstetrics and Gynecology

## 2016-02-15 ENCOUNTER — Encounter: Payer: Self-pay | Admitting: Obstetrics and Gynecology

## 2016-02-15 VITALS — BP 148/87 | HR 76 | Temp 97.0°F | Ht 66.0 in | Wt 161.9 lb

## 2016-02-15 DIAGNOSIS — Z90710 Acquired absence of both cervix and uterus: Secondary | ICD-10-CM

## 2016-02-15 DIAGNOSIS — Z87891 Personal history of nicotine dependence: Secondary | ICD-10-CM | POA: Insufficient documentation

## 2016-02-15 DIAGNOSIS — Z79899 Other long term (current) drug therapy: Secondary | ICD-10-CM

## 2016-02-15 DIAGNOSIS — F419 Anxiety disorder, unspecified: Secondary | ICD-10-CM

## 2016-02-15 DIAGNOSIS — Z923 Personal history of irradiation: Secondary | ICD-10-CM | POA: Insufficient documentation

## 2016-02-15 DIAGNOSIS — Z8541 Personal history of malignant neoplasm of cervix uteri: Secondary | ICD-10-CM | POA: Insufficient documentation

## 2016-02-15 DIAGNOSIS — Z90722 Acquired absence of ovaries, bilateral: Secondary | ICD-10-CM | POA: Insufficient documentation

## 2016-02-15 DIAGNOSIS — Z9221 Personal history of antineoplastic chemotherapy: Secondary | ICD-10-CM | POA: Diagnosis not present

## 2016-02-15 DIAGNOSIS — F329 Major depressive disorder, single episode, unspecified: Secondary | ICD-10-CM | POA: Insufficient documentation

## 2016-02-15 DIAGNOSIS — N200 Calculus of kidney: Secondary | ICD-10-CM | POA: Diagnosis not present

## 2016-02-15 DIAGNOSIS — E041 Nontoxic single thyroid nodule: Secondary | ICD-10-CM | POA: Diagnosis not present

## 2016-02-15 DIAGNOSIS — C539 Malignant neoplasm of cervix uteri, unspecified: Secondary | ICD-10-CM

## 2016-02-15 NOTE — Progress Notes (Signed)
Patient here for follow up. No changes except wt gain of approx. 14 lbs.

## 2016-02-15 NOTE — Progress Notes (Signed)
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 female who is seen today for surveillance.  She is doing well today and has no gyn complaints. She has not had any further symptoms of vaginal spotting.   She is worried about weight gain. This may in part be attributed to smoking cessation and she is eating more. She is also struggling with depression and anxiety, worried that something bad will happen. She did see her PCP recently. She forgot to see Dr. Leafy Ro.   Gynecologic Oncology History Deanna Haynes is a 50 y.o. female who is seen in consultation from Dr. Leafy Ro for cervical cancer. She was diagnosed with stage IB2 cervical cancer. Preoperative PET was negative for metastatic disease. She did have a 4 cm hypermetabolic left thyroid nodule.   On 11/26/14 she underwent robotic radical hysterectomy.    Decision was made to do adjuvant chemoradiation in view of intermediate high risk criteria.    Pathology A.Cervical mass, biopsy: Invasive endocervical adenocarcinoma, See COMMENT. TUMOR Histologic Type:Adenocarcinoma Histologic Grade:G2: Moderately differentiated  EXTENT Tumor Size:Greatest dimension (cm): 3.2  Additional Dimension (cm):2.4  Additional Dimension (cm):1.9 Stromal Invasion  Depth (mm):7  MARGINS Margins:Margins uninvolved by invasive carcinoma  Distance of Invasive Carcinoma from Closest Margin:Specify (mm): 0.5  Margin:Specify margin: Anterior paracervical soft tissue margin (deep)  ACCESSORY FINDINGS Lymph-Vascular Invasion:Not identified  Right pelvic lymph nodes - 5nodes, negative, including negative SLN.  Left pelvic lymph node -  - 10 nodes, negative, including negative SLNs.. Parametrial tissue with one lymph node, negative for malignancy (0/1).  Given  high intermediate risk factors we recommended adjuvant therapy.  Dr Oliva Bustard performed the cisplatin chemotherapy, completed 01/31/2015, and Dr Donella Stade performed the radiation, the plan was for 4500 cGy WPRT followed by vaginal boost. She received approximately 3780 cGy and is not going to complete the vaginal boost due to the vaginal dehiscence.   On 02/01/2015 she was undergoing insertion of vaginal dilator for planned vaginal brachtherapy. With insertion she was noted to have perforation of vaginal wall with prolapse of bowel into vagina. She was transferred to the ED and subsequently to [facility]. She underwent Exam under anesthesia, vaginal approach to vaginal cuff closure by Dr. Truman Hayward, Gyn Onc, at [facility] for vaginal cuff dehiscence. Her postoperative course was unremarkable.   CT scan 08/01/2015 that was negative.  IMPRESSION:Previous hysterectomy. No evidence of recurrent or metastatic carcinoma within the abdomen or pelvis. Tiny nonobstructive left renal calculus. No evidence of ureteral calculi or hydronephrosis.   It sounds like she may have had follow up for the thyroid nodule per her report and no further intervention is needed, but I do not see a note from the ENT surgeon. No further evaluation needed per patient.   Problem List: Patient Active Problem List   Diagnosis Date Noted  . Dehiscence of incision 02/01/2015  . Anxiety 12/03/2014  . Lump in thyroid 11/25/2014  . Malignant neoplasm of cervix ([place]) 11/24/2014  . Cervical cancer ([place]) 11/17/2014    Past Medical History: Past Medical History:  Diagnosis Date  . Anxiety   . Cervical cancer [facility])    Radical Hysterectomy 11/26/14 with adjuvant chemoradiation  . Vaginal cuff dehiscence     Past Surgical History: Past Surgical History:  Procedure Laterality Date  . RADICAL HYSTERECTOMY  11/26/2014   Stage IB2 Cervical Cancer, adjuvant chemoradiation  . REPAIR VAGINAL CUFF  Past Gynecologic History:  Menarche: 11 LMP: 10  years ago History of Abnormal pap: No Last Pap: see prior notes Contraception: none   OB History:  G0  Family History: No family history on file.  Social History: Social History   Social History  . Marital status: Single    Spouse name: N/A  . Number of children: N/A  . 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 Smoking status: Former Smoker    Packs/day: 1.00    Years: 20.00    Types: Cigarettes    Quit date: 11/01/2014  . Smokeless tobacco: Never Used  . Alcohol use No  . Drug use: No  . Sexual activity: Not on file   Other Topics Concern  . Not on file   Social History Narrative  . No narrative on file    Allergies: Allergies  Allergen Reactions  . Codeine Nausea And Vomiting    Current Medications: Current Outpatient Prescriptions  Medication Sig Dispense Refill  . clonazePAM (KLONOPIN) 0.5 MG tablet Take 0.5 mg by mouth daily.    . ferrous sulfate 325 (65 FE) MG tablet Take 325 mg by mouth daily with breakfast.    . Multiple Vitamins-Minerals (MULTIVITAMIN WITH MINERALS) tablet Take 1 tablet by mouth daily.    . sertraline (ZOLOFT) 50 MG tablet     . traZODone (DESYREL) 50 MG tablet Take 50 mg by mouth at bedtime.    . vitamin B-12 (CYANOCOBALAMIN) 100 MCG tablet Take 100 mcg by mouth daily.    . vitamin C (ASCORBIC ACID) 500 MG tablet Take 500 mg by mouth 2 (two) times daily.     No current facility-administered medications for this visit.     Review of Systems General: weight gain  HEENT: no complaints  Lungs: no complaints  Cardiac: no complaints  GI: no complaints  GU: no complaints  Musculoskeletal: no complaints  Extremities: no complaints  Skin: no complaints  Neuro: no complaints  Endocrine: no complaints  Psych: depression     Objective:  Physical Examination:  BP (!) 148/87 (BP Location: Left Arm, Patient Position: Sitting)   Pulse 76   Temp 97 F (36.1 C) (Tympanic)   Ht  5\' 6"  (1.676 m)   Wt 161 lb 14.9 oz (73.5 kg)   BMI 26.14 kg/m    ECOG Performance Status: 0 - Asymptomatic  General appearance: alert, cooperative and appears stated age HEENT:PERRLA and sclera clear, anicteric  Neck: more prominent thyroid compared to prior exams CV: RRR Lungs: BCTA Lymph node survey: negative [place], axillary, inguinal nodes Abdomen: soft, non-tender, without masses or organomegaly, no hernias  Extremities: extremities normal, atraumatic, no cyanosis or edema Neurological exam reveals alert, oriented, normal speech, no focal findings or movement disorder noted.  Pelvic: exam chaperoned by nurse;  Vulva: normal appearing vulva with no masses, tenderness or lesions; Vagina: shortened and cuff appears completely closed. Uterus/Cervix: surgically absent.  Pap obtained. BME negative for masses or nodularity. RV confirms    Assessment:  Deanna Haynes is a 50 y.o. 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 On 11/26/2014 she had robotic radical hysterectomy, BSO with negative pelvic nodes, negative parametria and margins, but intermediate risk factors based on Sedlis criteria. S/p chemoradiation. History of vaginal dehiscence s/p vaginal cuff repair. History of tobacco use s/p Nicoderm patch, now stopped smoking. Weight gain. Depression.   Medical co-morbidities complicating care: tobacco usage and  anxiety. Thyroid mass. Plan:   Problem List Items Addressed This Visit      Genitourinary   Cervical cancer ([place]) - Primary   Relevant Orders   Pap LB (liquid-based)      She will see Dr. Leafy Ro in 3 months and can institute usual well woman care. We will try to help ensure that appointment is made.  Follow up Pap obtained today.  I will see her back in 6 months.  We also discussed her weight and need for weight loss, stressed good nutrition, and exercise.   I did offer referral to ENT surgeon as the thyroid appears more enlarged to me compared  to prior exams.   I also suggested seeing a therapist for depression/anxiety and ensured her that these symptoms are very normal for cancer survivors.   With regard to her cervical cancer, I recommended continued close follow up with exams, including pelvic exams every 3-6 months for 2 years, then every 6-12 months for 3-5 years and then annually thereafter. Cervical/vaginal (Pap smears) cytology may be considered annually, but is thought to have limited value for detecting recurrent cervical cancer. Imaging and laboratory assessment is based on clinical symptoms or findings.  Gillis Ends, MD  CC:  Benjaman Kindler, MD

## 2016-02-15 NOTE — Progress Notes (Signed)
  Oncology Nurse Navigator Documentation Chaperoned pelvic exam. Assited with pap and pap sent to lab Navigator Location: [facility] (02/15/16 1500)   )Navigator Encounter Type: Clinic/[facility]C;Follow-up Appt (02/15/16 1500)                                                    Time Spent with Patient: 15 (02/15/16 1500)

## 2016-02-20 LAB — PAP LB (LIQUID-BASED): PAP Smear Comment: 0

## 2016-02-27 NOTE — Progress Notes (Signed)
  Oncology Nurse Navigator Documentation Pap smear results noted. Deanna Haynes did not want to be notified of negative results.  Navigator Location: [facility] (02/27/16 1100)   )Navigator Encounter Type: Diagnostic Results (02/27/16 1100)                                                    Time Spent with Patient: 15 (02/27/16 1100)

## 2016-04-18 ENCOUNTER — Other Ambulatory Visit: Payer: Self-pay | Admitting: Nurse Practitioner

## 2016-08-15 ENCOUNTER — Ambulatory Visit: Payer: 59

## 2016-08-29 ENCOUNTER — Ambulatory Visit: Payer: 59

## 2017-03-31 IMAGING — PT NM PET TUM IMG INITIAL (PI) SKULL BASE T - THIGH
1 of 9 series · 1 of 25 positions shown · non-contrast
Comparison: None.

CLINICAL DATA: Initial treatment strategy for cervical
adenocarcinoma.

EXAM:
NUCLEAR MEDICINE PET SKULL BASE TO THIGH
TECHNIQUE: 12.7 mCi F-18 FDG was injected intravenously. Full-ring PET imaging
was performed from the skull base to thigh after the radiotracer. CT
data was obtained and used for attenuation correction and anatomic
localization.
FASTING BLOOD GLUCOSE:  Value: 94 mg/dl

[Series 3: ct wb 5.0 b30f · axial · 5.0mm · 0.98mm/px · 1 of 290 slices shown]
[im 290/290  brain]
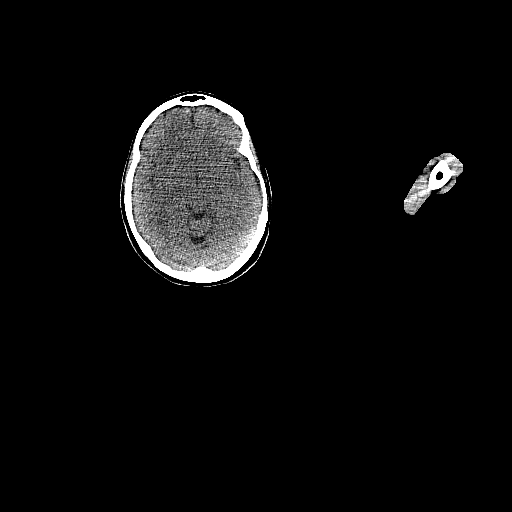

[1 of 25 positions shown; findings below may reference images not displayed]

FINDINGS: NECK

No hypermetabolic lymph nodes in the neck. A 4.2 cm low-attenuation
left thyroid lobe nodule shows hypermetabolic activity, with SUV max
of 4.8. Primary thyroid carcinoma cannot be excluded.

CHEST

No hypermetabolic mediastinal or hilar nodes. No suspicious
pulmonary nodules on the CT scan.

ABDOMEN/PELVIS

Hypermetabolic soft tissue density measuring approximately 4 cm is
seen in the region of uterine cervix, which has SUV max of 28.7.
This is consistent with known primary cervical carcinoma.

No abnormal hypermetabolic activity within the liver, pancreas,
adrenal glands, or spleen. No hypermetabolic lymph nodes in the
abdomen or pelvis.

SKELETON

No focal hypermetabolic activity to suggest skeletal metastasis.
IMPRESSION: Hypermetabolic mass in the uterine cervix, consistent with known
primary cervical carcinoma.

No evidence of nodal or distant metastatic disease.

4 cm hypermetabolic left thyroid lobe nodule. Primary thyroid
carcinoma cannot be excluded. Consider thyroid ultrasound and
percutaneous needle aspiration for further evaluation. This follows
ACR consensus guidelines: Managing Incidental Thyroid Nodules
Detected on Imaging: White Paper of [REDACTED]. [HOSPITAL] 4059; [DATE].

## 2017-11-27 ENCOUNTER — Observation Stay (HOSPITAL_COMMUNITY)
Admission: EM | Admit: 2017-11-27 | Discharge: 2017-11-29 | Disposition: A | Discharge status: Home / Self Care | Payer: Managed Care, Other (non HMO) | Source: Home / Self Care | Attending: Emergency Medicine | Admitting: Emergency Medicine

## 2017-11-27 ENCOUNTER — Encounter: Payer: Self-pay | Admitting: Emergency Medicine

## 2017-11-27 ENCOUNTER — Other Ambulatory Visit: Payer: Self-pay

## 2017-11-27 DIAGNOSIS — Z79899 Other long term (current) drug therapy: Secondary | ICD-10-CM | POA: Insufficient documentation

## 2017-11-27 DIAGNOSIS — G2581 Restless legs syndrome: Secondary | ICD-10-CM | POA: Insufficient documentation

## 2017-11-27 DIAGNOSIS — K219 Gastro-esophageal reflux disease without esophagitis: Secondary | ICD-10-CM | POA: Insufficient documentation

## 2017-11-27 DIAGNOSIS — E872 Acidosis: Secondary | ICD-10-CM | POA: Insufficient documentation

## 2017-11-27 DIAGNOSIS — Z87891 Personal history of nicotine dependence: Secondary | ICD-10-CM

## 2017-11-27 DIAGNOSIS — A419 Sepsis, unspecified organism: Secondary | ICD-10-CM | POA: Diagnosis not present

## 2017-11-27 DIAGNOSIS — R739 Hyperglycemia, unspecified: Secondary | ICD-10-CM | POA: Insufficient documentation

## 2017-11-27 DIAGNOSIS — R197 Diarrhea, unspecified: Secondary | ICD-10-CM | POA: Insufficient documentation

## 2017-11-27 DIAGNOSIS — F411 Generalized anxiety disorder: Secondary | ICD-10-CM | POA: Insufficient documentation

## 2017-11-27 DIAGNOSIS — E8889 Other specified metabolic disorders: Secondary | ICD-10-CM | POA: Insufficient documentation

## 2017-11-27 DIAGNOSIS — Z90710 Acquired absence of both cervix and uterus: Secondary | ICD-10-CM

## 2017-11-27 DIAGNOSIS — Z885 Allergy status to narcotic agent status: Secondary | ICD-10-CM

## 2017-11-27 DIAGNOSIS — Z8541 Personal history of malignant neoplasm of cervix uteri: Secondary | ICD-10-CM | POA: Insufficient documentation

## 2017-11-27 DIAGNOSIS — E876 Hypokalemia: Secondary | ICD-10-CM

## 2017-11-27 DIAGNOSIS — F329 Major depressive disorder, single episode, unspecified: Secondary | ICD-10-CM

## 2017-11-27 DIAGNOSIS — F1010 Alcohol abuse, uncomplicated: Secondary | ICD-10-CM

## 2017-11-27 DIAGNOSIS — R627 Adult failure to thrive: Secondary | ICD-10-CM | POA: Diagnosis not present

## 2017-11-27 DIAGNOSIS — I10 Essential (primary) hypertension: Secondary | ICD-10-CM

## 2017-11-27 DIAGNOSIS — R112 Nausea with vomiting, unspecified: Secondary | ICD-10-CM

## 2017-11-27 DIAGNOSIS — E8729 Other acidosis: Secondary | ICD-10-CM

## 2017-11-27 LAB — URINALYSIS, COMPLETE (UACMP) WITH MICROSCOPIC
Bilirubin Urine: NEGATIVE
Glucose, UA: NEGATIVE mg/dL
Hgb urine dipstick: NEGATIVE
Ketones, ur: 20 mg/dL — AB
Leukocytes, UA: NEGATIVE
NITRITE: NEGATIVE
PH: 5 (ref 5.0–8.0)
Protein, ur: 30 mg/dL — AB
Specific Gravity, Urine: 1.021 (ref 1.005–1.030)

## 2017-11-27 LAB — COMPREHENSIVE METABOLIC PANEL
ALBUMIN: 4.1 g/dL (ref 3.5–5.0)
ALT: 11 U/L (ref 0–44)
ANION GAP: 21 — AB (ref 5–15)
AST: 16 U/L (ref 15–41)
Alkaline Phosphatase: 73 U/L (ref 38–126)
BILIRUBIN TOTAL: 1.9 mg/dL — AB (ref 0.3–1.2)
BUN: 37 mg/dL — ABNORMAL HIGH (ref 6–20)
CO2: 22 mmol/L (ref 22–32)
Calcium: 10.1 mg/dL (ref 8.9–10.3)
Chloride: 96 mmol/L — ABNORMAL LOW (ref 98–111)
Creatinine, Ser: 0.97 mg/dL (ref 0.44–1.00)
GFR calc Af Amer: >60 mL/min (ref >60)
Glucose, Bld: 204 mg/dL — ABNORMAL HIGH (ref 70–99)
POTASSIUM: 3.4 mmol/L — AB (ref 3.5–5.1)
Sodium: 139 mmol/L (ref 135–145)
TOTAL PROTEIN: 8.3 g/dL — AB (ref 6.5–8.1)

## 2017-11-27 LAB — CBC
HEMATOCRIT: 46.5 % (ref 35.0–47.0)
HEMOGLOBIN: 16.1 g/dL — AB (ref 12.0–16.0)
MCH: 29.2 pg (ref 26.0–34.0)
MCHC: 34.7 g/dL (ref 32.0–36.0)
MCV: 84.1 fL (ref 80.0–100.0)
Platelets: 502 10*3/uL — ABNORMAL HIGH (ref 150–440)
RBC: 5.52 MIL/uL — ABNORMAL HIGH (ref 3.80–5.20)
RDW: 13.2 % (ref 11.5–14.5)
WBC: 15.4 10*3/uL — AB (ref 3.6–11.0)

## 2017-11-27 LAB — ETHANOL

## 2017-11-27 LAB — LIPASE, BLOOD: Lipase: 24 U/L (ref 11–51)

## 2017-11-27 MED ORDER — PNEUMOCOCCAL VAC POLYVALENT 25 MCG/0.5ML IJ INJ
0.5000 mL | INJECTION | INTRAMUSCULAR | Status: DC
  Filled 2017-11-27 (×2): qty 0.5

## 2017-11-27 MED ORDER — ENOXAPARIN SODIUM 40 MG/0.4ML ~~LOC~~ SOLN
40.0000 mg | SUBCUTANEOUS | Status: DC
  Filled 2017-11-27 (×2): qty 0.4

## 2017-11-27 MED ORDER — THIAMINE HCL 100 MG/ML IJ SOLN
100.0000 mg | Freq: Every day | INTRAMUSCULAR | Status: DC
  Administered 2017-11-27 – 2017-11-29 (×3): 100 mg via INTRAVENOUS
  Filled 2017-11-27: qty 2
  Filled 2017-11-27 (×2): qty 1

## 2017-11-27 MED ORDER — LORAZEPAM 2 MG/ML IJ SOLN
1.0000 mg | Freq: Once | INTRAMUSCULAR | Status: AC
  Administered 2017-11-27: 1 mg via INTRAVENOUS
  Filled 2017-11-27: qty 1

## 2017-11-27 MED ORDER — FOLIC ACID 1 MG PO TABS
1.0000 mg | ORAL_TABLET | Freq: Every day | ORAL | Status: DC
  Filled 2017-11-27 (×2): qty 1

## 2017-11-27 MED ORDER — ROPINIROLE HCL 1 MG PO TABS
0.5000 mg | ORAL_TABLET | Freq: Every day | ORAL | Status: DC
  Administered 2017-11-27 – 2017-11-28 (×2): 0.5 mg via ORAL
  Filled 2017-11-27 (×2): qty 1

## 2017-11-27 MED ORDER — LORAZEPAM 2 MG/ML IJ SOLN
1.0000 mg | Freq: Four times a day (QID) | INTRAMUSCULAR | Status: DC | PRN
  Administered 2017-11-27 – 2017-11-28 (×2): 1 mg via INTRAVENOUS
  Filled 2017-11-27 (×2): qty 1

## 2017-11-27 MED ORDER — SODIUM CHLORIDE 0.9 % IV SOLN
Freq: Once | INTRAVENOUS | Status: AC
  Administered 2017-11-27: 12:00:00 via INTRAVENOUS

## 2017-11-27 MED ORDER — ACETAMINOPHEN 325 MG PO TABS
650.0000 mg | ORAL_TABLET | Freq: Four times a day (QID) | ORAL | Status: DC | PRN
  Administered 2017-11-28 (×2): 650 mg via ORAL
  Filled 2017-11-27 (×2): qty 2

## 2017-11-27 MED ORDER — ONDANSETRON HCL 4 MG PO TABS
4.0000 mg | ORAL_TABLET | Freq: Four times a day (QID) | ORAL | Status: DC | PRN
  Administered 2017-11-28: 16:00:00 4 mg via ORAL
  Filled 2017-11-27: qty 1

## 2017-11-27 MED ORDER — MULTI-VITAMIN/MINERALS PO TABS: 1.0000 | ORAL_TABLET | Freq: Every day | ORAL | Status: DC

## 2017-11-27 MED ORDER — FAMOTIDINE IN NACL 20-0.9 MG/50ML-% IV SOLN
20.0000 mg | Freq: Once | INTRAVENOUS | Status: AC
  Administered 2017-11-27: 20 mg via INTRAVENOUS
  Filled 2017-11-27: qty 50

## 2017-11-27 MED ORDER — LORAZEPAM 1 MG PO TABS: 1.0000 mg | ORAL_TABLET | Freq: Once | ORAL | Status: DC

## 2017-11-27 MED ORDER — ONDANSETRON HCL 4 MG/2ML IJ SOLN
INTRAMUSCULAR | Status: AC
  Filled 2017-11-27: qty 2

## 2017-11-27 MED ORDER — ONDANSETRON HCL 4 MG/2ML IJ SOLN
4.0000 mg | Freq: Once | INTRAMUSCULAR | Status: AC
  Administered 2017-11-27: 4 mg via INTRAVENOUS
  Filled 2017-11-27: qty 2

## 2017-11-27 MED ORDER — CLONAZEPAM 0.5 MG PO TABS
0.5000 mg | ORAL_TABLET | Freq: Every day | ORAL | Status: DC
  Administered 2017-11-27 – 2017-11-28 (×2): 0.5 mg via ORAL
  Filled 2017-11-27 (×3): qty 1

## 2017-11-27 MED ORDER — ONDANSETRON HCL 4 MG/2ML IJ SOLN
4.0000 mg | Freq: Once | INTRAMUSCULAR | Status: AC | PRN
  Administered 2017-11-27: 4 mg via INTRAVENOUS

## 2017-11-27 MED ORDER — ADULT MULTIVITAMIN W/MINERALS CH
1.0000 | ORAL_TABLET | Freq: Every day | ORAL | Status: DC
  Filled 2017-11-27 (×2): qty 1

## 2017-11-27 MED ORDER — TRAZODONE HCL 50 MG PO TABS
50.0000 mg | ORAL_TABLET | Freq: Every day | ORAL | Status: DC
  Administered 2017-11-27 – 2017-11-28 (×2): 50 mg via ORAL
  Filled 2017-11-27 (×2): qty 1

## 2017-11-27 MED ORDER — SODIUM CHLORIDE 0.9 % IV SOLN
INTRAVENOUS | Status: DC
  Administered 2017-11-27 – 2017-11-28 (×2): via INTRAVENOUS

## 2017-11-27 MED ORDER — ONDANSETRON HCL 4 MG/2ML IJ SOLN
4.0000 mg | Freq: Four times a day (QID) | INTRAMUSCULAR | Status: DC | PRN
  Administered 2017-11-27 – 2017-11-29 (×3): 4 mg via INTRAVENOUS
  Filled 2017-11-27 (×3): qty 2

## 2017-11-27 MED ORDER — ACETAMINOPHEN 650 MG RE SUPP: 650.0000 mg | Freq: Four times a day (QID) | RECTAL | Status: DC | PRN

## 2017-11-27 MED ORDER — VITAMIN C 500 MG PO TABS
500.0000 mg | ORAL_TABLET | Freq: Two times a day (BID) | ORAL | Status: DC
  Filled 2017-11-27 (×6): qty 1

## 2017-11-27 MED ORDER — VITAMIN B-1 100 MG PO TABS
100.0000 mg | ORAL_TABLET | Freq: Every day | ORAL | Status: DC
  Filled 2017-11-27 (×2): qty 1

## 2017-11-27 MED ORDER — SERTRALINE HCL 50 MG PO TABS
100.0000 mg | ORAL_TABLET | Freq: Every day | ORAL | Status: DC
  Administered 2017-11-27 – 2017-11-28 (×2): 100 mg via ORAL
  Filled 2017-11-27 (×2): qty 2

## 2017-11-27 MED ORDER — LORAZEPAM 1 MG PO TABS
1.0000 mg | ORAL_TABLET | Freq: Four times a day (QID) | ORAL | Status: DC | PRN
  Administered 2017-11-28: 1 mg via ORAL
  Filled 2017-11-27: qty 1

## 2017-11-27 MED ORDER — PROMETHAZINE HCL 25 MG/ML IJ SOLN
25.0000 mg | Freq: Four times a day (QID) | INTRAMUSCULAR | Status: DC | PRN
  Administered 2017-11-27 – 2017-11-28 (×3): 25 mg via INTRAVENOUS
  Filled 2017-11-27 (×3): qty 1

## 2017-11-27 MED ORDER — VITAMIN B-12 100 MCG PO TABS
100.0000 ug | ORAL_TABLET | Freq: Every day | ORAL | Status: DC
  Filled 2017-11-27 (×3): qty 1

## 2017-11-27 MED ORDER — FERROUS SULFATE 325 (65 FE) MG PO TABS
325.0000 mg | ORAL_TABLET | Freq: Every day | ORAL | Status: DC
  Filled 2017-11-27 (×2): qty 1

## 2017-11-27 NOTE — Consult Note (Signed)
[facility] Face-to-Face Psychiatry Consult   Reason for Consult: Consult for 52 year old woman who came to the emergency room with symptoms of nausea dizziness and generally feeling unwell.  Concern about alcohol use Referring Physician: Verdell Carmine Patient Identification: Deanna Haynes MRN:  [id number] Principal Diagnosis: Alcohol abuse Diagnosis:   Patient Active Problem List   Diagnosis Date Noted  . 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 Alcohol abuse [F10.10] 11/27/2017  . Generalized anxiety disorder [F41.1] 11/27/2017  . Dehiscence of incision [T81.31XA] 02/01/2015  . Anxiety [F41.9] 12/03/2014  . Lump in thyroid [E07.9] 11/25/2014  . Malignant neoplasm of cervix ([place]) [C53.9] 11/24/2014  . Cervical cancer ([place]) [C53.9] 11/17/2014    Total Time spent with patient: 1 hour  Subjective:   Deanna Haynes is a 52 y.o. female patient admitted with "my medicines were not sitting right with me".  HPI: Patient seen chart reviewed.  Patient tells me that a week or so ago she had what she believes is food poisoning.  She felt sick to her stomach and so she stopped taking all of her medicines particularly her Zoloft, Requip, trazodone and clonazepam.  She says that then she started retaking them but continued to have nausea weakness lack of appetite dry heaves.  She indicates some belief that she is blaming her medicine for this but does not have a clearly logical story for why that would be the case.  Patient says her mood in general has been stable.  She has chronic anxiety problems.  Present for years.  About the same as usual.  Some difficulty falling asleep at night.  Overall says that she has been a little more run down recently but is vague about the time course of that.  Has been on the same psychiatric medicine for quite a while.  Patient admits that she has been drinking more heavily.  She says for the past 3 months or so she has escalated her drinking and is consuming about 3 standard size  bottles of wine per week.  It sounds like in the chart that at least some friends may be suggesting that the actual consumption is more than that.  Patient insinuates that the reason she is drinking more is because doctors will not give her enough Xanax or other antianxiety medicine and she feels that she needs it for her chronic nerves.  Social history: Patient lives alone.  Not working anymore.  Sounds like she tries to stay social with friends in the area although she admits she has not been able to do her usual activities as much recently.  Medical history: Patient is a survivor of cervical cancer and had to have a hysterectomy and chemotherapy.  Patient still feels very traumatized by it even though apparently she is technically in full remission right now.  Also has a history of restless leg syndrome.  Substance abuse history: Denies ever having had problems with alcohol or drug abuse in the past.  Nothing in the chart suggest any past substance abuse.  Past Psychiatric History: Patient says she has had anxiety problems for years and years even before her cancer but the cancer made everything worse.  She finds herself still worrying every day that her cancer will recur.  She has been treated by therapist in the past but is not currently seeing a therapist but is just seeing a general practitioner who prescribes her Zoloft and Klonopin and trazodone.  Patient used to take higher doses of Xanax and complains about not being able to  get enough antianxiety medicine anymore.  No history of hospitalization no history of suicide attempts or psychosis.  Risk to Self:   Risk to Others:   Prior Inpatient Therapy:   Prior Outpatient Therapy:    Past Medical History:  Past Medical History:  Diagnosis Date  . Anxiety   . Cervical cancer [facility])    Radical Hysterectomy 11/26/14 with adjuvant chemoradiation  . Vaginal cuff dehiscence     Past Surgical History:  Procedure Laterality Date  . RADICAL  HYSTERECTOMY  11/26/2014   Stage IB2 Cervical Cancer, adjuvant chemoradiation  . REPAIR VAGINAL CUFF     Family History:  Family History  Problem Relation Age of Onset  . Anxiety disorder Mother   . Cervical cancer Mother    Family Psychiatric  History: History of anxiety in her mother and other members of the family Social History:  Social History   Substance and Sexual Activity  Alcohol Use Yes   Comment:  3 bottles a wine a week.      Social History   Substance and Sexual Activity  Drug Use No    Social History   Socioeconomic History  . Marital status: Single    Spouse name: Not on file  . Number of children: Not on file  . Years of education: Not on file  . Highest education level: Not on file  Occupational History  . Occupation: Quarry manager  Social Needs  . Financial resource strain: Not on file  . Food insecurity:    Worry: Not on file    Inability: Not on file  . Transportation needs:    Medical: Not on file    Non-medical: Not on file  Tobacco Use  . Smoking status: Former Smoker    Packs/day: 1.00    Years: 20.00    Pack years: 20.00    Types: Cigarettes    Last attempt to quit: 11/01/2014    Years since quitting: 3.0  . Smokeless tobacco: Never Used  Substance and Sexual Activity  . Alcohol use: Yes    Comment:  3 bottles a wine a week.   . Drug use: No  . Sexual activity: Not on file  Lifestyle  . Physical activity:    Days per week: Not on file    Minutes per session: Not on file  . Stress: Not on file  Relationships  . Social connections:    Talks on phone: Not on file    Gets together: Not on file    Attends religious service: Not on file    Active member of club or organization: Not on file    Attends meetings of clubs or organizations: Not on file    Relationship status: Not on file  Other Topics Concern  . Not on file  Social History Narrative  . Not on file   Additional Social History:    Allergies:   Allergies  Allergen  Reactions  . Codeine Nausea And Vomiting    Labs:  Results for orders placed or performed during the hospital encounter of 11/27/17 (from the past 48 hour(s))  Lipase, blood     Status: None   Collection Time: 11/27/17 11:45 AM  Result Value Ref Range   Lipase 24 11 - 51 U/L    Comment: Performed at [facility], [address] [place], [place] [address]  Comprehensive metabolic panel     Status: Abnormal   Collection Time: 11/27/17 11:45 AM  Result Value Ref Range  Sodium 139 135 - 145 mmol/L    Comment: ELECTROLYTES REPEATED. QSD   Potassium 3.4 (L) 3.5 - 5.1 mmol/L   Chloride 96 (L) 98 - 111 mmol/L   CO2 22 22 - 32 mmol/L   Glucose, Bld 204 (H) 70 - 99 mg/dL   BUN 37 (H) 6 - 20 mg/dL   Creatinine, Ser 0.97 0.44 - 1.00 mg/dL   Calcium 10.1 8.9 - 10.3 mg/dL   Total Protein 8.3 (H) 6.5 - 8.1 g/dL   Albumin 4.1 3.5 - 5.0 g/dL   AST 16 15 - 41 U/L   ALT 11 0 - 44 U/L   Alkaline Phosphatase 73 38 - 126 U/L   Total Bilirubin 1.9 (H) 0.3 - 1.2 mg/dL   GFR calc non Af Amer >60 >60 mL/min   GFR calc Af Amer >60 >60 mL/min    Comment: (NOTE) The eGFR has been calculated using the CKD EPI equation. This calculation has not been validated in all clinical situations. eGFR's persistently <60 mL/min signify possible Chronic Kidney Disease.    Anion gap 21 (H) 5 - 15    Comment: Performed at [facility], [address] [place], [place] [address]  CBC     Status: Abnormal   Collection Time: 11/27/17 11:45 AM  Result Value Ref Range   WBC 15.4 (H) 3.6 - 11.0 K/uL   RBC 5.52 (H) 3.80 - 5.20 MIL/uL   Hemoglobin 16.1 (H) 12.0 - 16.0 g/dL   HCT 46.5 35.0 - 47.0 %   MCV 84.1 80.0 - 100.0 fL   MCH 29.2 26.0 - 34.0 pg   MCHC 34.7 32.0 - 36.0 g/dL   RDW 13.2 11.5 - 14.5 %   Platelets 502 (H) 150 - 440 K/uL    Comment: Performed at [facility], [address] [place], [place] [address]  Urinalysis, Complete w Microscopic     Status: Abnormal    Collection Time: 11/27/17 11:45 AM  Result Value Ref Range   Color, Urine YELLOW (A) YELLOW   APPearance HAZY (A) CLEAR   Specific Gravity, Urine 1.021 1.005 - 1.030   pH 5.0 5.0 - 8.0   Glucose, UA NEGATIVE NEGATIVE mg/dL   Hgb urine dipstick NEGATIVE NEGATIVE   Bilirubin Urine NEGATIVE NEGATIVE   Ketones, ur 20 (A) NEGATIVE mg/dL   Protein, ur 30 (A) NEGATIVE mg/dL   Nitrite NEGATIVE NEGATIVE   Leukocytes, UA NEGATIVE NEGATIVE   RBC / HPF 6-10 0 - 5 RBC/hpf   WBC, UA 0-5 0 - 5 WBC/hpf   Bacteria, UA RARE (A) NONE SEEN   Squamous Epithelial / LPF 6-10 0 - 5   Mucus PRESENT    Hyaline Casts, UA PRESENT     Comment: Performed at [facility], [address] [place], [place] [address]  Ethanol     Status: None   Collection Time: 11/27/17  1:46 PM  Result Value Ref Range   Alcohol, Ethyl (B) <10 <10 mg/dL    Comment: (NOTE) Lowest detectable limit for serum alcohol is 10 mg/dL. For medical purposes only. Performed at [facility], [address] [place],  [address]     Current Facility-Administered Medications  Medication Dose Route Frequency Provider Last Rate Last Dose  . 0.9 %  sodium chloride infusion   Intravenous Continuous Henreitta Leber, MD 125 mL/hr at 11/27/17 1520    . acetaminophen (TYLENOL) tablet 650 mg  650 mg Oral Q6H PRN Henreitta Leber, MD  Or  . acetaminophen (TYLENOL) suppository 650 mg  650 mg Rectal Q6H PRN Henreitta Leber, MD      . clonazePAM Bobbye [place]) tablet 0.5 mg  0.5 mg Oral Daily Henreitta Leber, MD   0.5 mg at 11/27/17 1644  . enoxaparin (LOVENOX) injection 40 mg  40 mg Subcutaneous Q24H Sainani, Vivek J, MD      . ferrous sulfate tablet 325 mg  325 mg Oral Q breakfast Sainani, Belia Heman, MD      . folic acid (FOLVITE) tablet 1 mg  1 mg Oral Daily Sainani, Vivek J, MD      . LORazepam (ATIVAN) tablet 1 mg  1 mg Oral Q6H PRN Henreitta Leber, MD       Or  . LORazepam (ATIVAN) injection 1 mg  1 mg Intravenous  Q6H PRN Henreitta Leber, MD   1 mg at 11/27/17 1513  . multivitamin with minerals tablet 1 tablet  1 tablet Oral Daily Sainani, Belia Heman, MD      . ondansetron (ZOFRAN) tablet 4 mg  4 mg Oral Q6H PRN Henreitta Leber, MD       Or  . ondansetron (ZOFRAN) injection 4 mg  4 mg Intravenous Q6H PRN Henreitta Leber, MD      . Derrill Memo ON 11/28/2017] pneumococcal 23 valent vaccine (PNU-IMMUNE) injection 0.5 mL  0.5 mL Intramuscular Tomorrow-1000 Sainani, Belia Heman, MD      . promethazine (PHENERGAN) injection 25 mg  25 mg Intravenous Q6H PRN Henreitta Leber, MD   25 mg at 11/27/17 1512  . rOPINIRole (REQUIP) tablet 0.5 mg  0.5 mg Oral QHS Sainani, Belia Heman, MD      . sertraline (ZOLOFT) tablet 100 mg  100 mg Oral QHS Sainani, Belia Heman, MD      . thiamine (VITAMIN B-1) tablet 100 mg  100 mg Oral Daily Sainani, Belia Heman, MD       Or  . thiamine (B-1) injection 100 mg  100 mg Intravenous Daily Henreitta Leber, MD   100 mg at 11/27/17 1415  . traZODone (DESYREL) tablet 50 mg  50 mg Oral QHS Sainani, Belia Heman, MD      . vitamin B-12 (CYANOCOBALAMIN) tablet 100 mcg  100 mcg Oral Daily Sainani, Belia Heman, MD      . vitamin C (ASCORBIC ACID) tablet 500 mg  500 mg Oral BID Henreitta Leber, MD        Musculoskeletal: Strength & Muscle Tone: decreased Gait & Station: unsteady Patient leans: N/A  Psychiatric Specialty Exam: Physical Exam  Nursing note and vitals reviewed. Constitutional: She appears well-developed and well-nourished.  HENT:  Head: Normocephalic and atraumatic.  Eyes: Pupils are equal, round, and reactive to light. Conjunctivae are normal.  Neck: Normal range of motion.  Cardiovascular: Regular rhythm and normal heart sounds.  Respiratory: Effort normal. No respiratory distress.  GI: Soft.  Musculoskeletal: Normal range of motion.  Neurological: She is alert.  Skin: Skin is warm and dry.  Psychiatric: Judgment normal. Her mood appears anxious. Her affect is blunt. Her speech is delayed. She  is slowed. Thought content is not paranoid. She expresses no homicidal and no suicidal ideation. She exhibits abnormal recent memory.    Review of Systems  Constitutional: Positive for malaise/fatigue.  HENT: Negative.   Eyes: Negative.   Respiratory: Negative.   Cardiovascular: Negative.   Gastrointestinal: Positive for heartburn and nausea.  Musculoskeletal: Negative.   Skin: Negative.   Neurological:  Negative.   Psychiatric/Behavioral: Positive for substance abuse. Negative for depression, hallucinations, memory loss and suicidal ideas. The patient is nervous/anxious and has insomnia.     Blood pressure (!) 174/80, pulse 90, temperature 97.9 F (36.6 C), temperature source Oral, resp. rate 18, height 5' 4"  (1.626 m), weight 72.6 kg, SpO2 92 %.Body mass index is 27.46 kg/m.  General Appearance: Disheveled  Eye Contact:  Minimal  Speech:  Slow  Volume:  Decreased  Mood:  Anxious and Dysphoric  Affect:  Constricted  Thought Process:  Disorganized  Orientation:  Full (Time, Place, and Person)  Thought Content:  Tangential  Suicidal Thoughts:  No  Homicidal Thoughts:  No  Memory:  Immediate;   Fair Recent;   Poor Remote;   Fair  Judgement:  Fair  Insight:  Shallow  Psychomotor Activity:  Decreased  Concentration:  Concentration: Poor  Recall:  [employer] of Knowledge:  Fair  Language:  Fair  Akathisia:  No  Handed:  Right  AIMS (if indicated):     Assets:  Desire for Improvement Financial Resources/Insurance Housing Resilience  ADL's:  Impaired  Cognition:  Impaired,  Mild  Sleep:        Treatment Plan Summary: Daily contact with patient to assess and evaluate symptoms and progress in treatment, Medication management and Plan Patient who comes into the hospital with alcoholic ketoacidosis.  She remains a little bit confused but not delirious during our interview.  Denies that she is been having any visual hallucinations.  Probably having a little bit of alcohol  withdrawal but is not in the range where she is at high risk for seizure.  No past history of DTs although it is not impossible that something like that could come up at this point.  Patient was educated about how it looks like her alcohol is clearly become a problem for her.  Empathized with her use of alcohol to deal with her chronic anxiety.  Probably ought to be seeing a therapist again it sounds like.  She can continue her Zoloft trazodone and modest dose of clonazepam if she can stomach it.  No need for psychiatric hospitalization.  I will follow-up as needed.  Disposition: No evidence of imminent risk to self or others at present.   Patient does not meet criteria for psychiatric inpatient admission. Supportive therapy provided about ongoing stressors. Discussed crisis plan, support from social network, calling 911, coming to the Emergency Department, and calling Suicide Hotline.  Alethia Berthold, MD 11/27/2017 6:08 PM

## 2017-11-27 NOTE — ED Notes (Signed)
Pt reports nausea improving but would like to wait a little longer to take POs

## 2017-11-27 NOTE — ED Provider Notes (Signed)
[facility] Emergency Department Provider Note       Time seen: ----------------------------------------- 11:55 AM on 11/27/2017 -----------------------------------------   I have reviewed the triage vital signs and the nursing notes.  HISTORY   Chief Complaint Weakness    HPI Deanna Haynes is a 52 y.o. female with a history of the and cervical cancer who presents to the ED for vomiting and weakness over the past week.  Patient reports symptoms got better and then they recurred today.  She has been unable to keep any of her medicines or antiemetics down.  She was seen by her doctor and was prescribed Phenergan which she has not been able to keep down.  She complains of indigestion as well.  She has had occasional episodes of diarrhea.  Past Medical History:  Diagnosis Date  . Anxiety   . Cervical cancer [facility])    Radical Hysterectomy 11/26/14 with adjuvant chemoradiation  . Vaginal cuff dehiscence     Patient Active Problem List   Diagnosis Date Noted  . Dehiscence of incision 02/01/2015  . Anxiety 12/03/2014  . Lump in thyroid 11/25/2014  . Malignant neoplasm of cervix ([place]) 11/24/2014  . Cervical cancer ([place]) 11/17/2014    Past Surgical History:  Procedure Laterality Date  . RADICAL HYSTERECTOMY  11/26/2014   Stage IB2 Cervical Cancer, adjuvant chemoradiation  . REPAIR VAGINAL CUFF      Allergies Codeine  Social History Social History   Tobacco Use  . Smoking status: Former Smoker    Packs/day: 1.00    Years: 20.00    Pack years: 20.00    Types: Cigarettes    Last attempt to quit: 11/01/2014    Years since quitting: 3.0  . Smokeless tobacco: Never Used  Substance Use Topics  . Alcohol use: No  . Drug use: No   Review of Systems Constitutional: Negative for fever. Cardiovascular: Negative for chest pain. Respiratory: Negative for shortness of breath. Gastrointestinal: Negative for abdominal pain, positive for vomiting and  diarrhea Musculoskeletal: Negative for back pain. Skin: Negative for rash. Neurological: Positive for generalized weakness  All systems negative/normal/unremarkable except as stated in the HPI  ____________________________________________   PHYSICAL EXAM:  VITAL SIGNS: ED Triage Vitals  Enc Vitals Group     BP --      Pulse Rate 11/27/17 1145 97     Resp 11/27/17 1145 (!) 30     Temp 11/27/17 1145 (!) 97.5 F (36.4 C)     Temp Source 11/27/17 1145 Oral     SpO2 11/27/17 1145 95 %     Weight 11/27/17 1141 160 lb (72.6 kg)     Height 11/27/17 1141 5\' 4"  (1.626 m)     Head Circumference --      Peak Flow --      Pain Score 11/27/17 1141 0     Pain Loc --      Pain Edu? --      Excl. in [place]? --     Constitutional: Alert and oriented. Well appearing and in no distress. Eyes: Conjunctivae are normal. Normal extraocular movements. ENT   Head: Normocephalic and atraumatic.   Nose: No congestion/rhinnorhea.   Mouth/Throat: Mucous membranes are moist.   Neck: No stridor. Cardiovascular: Normal rate, regular rhythm. No murmurs, rubs, or gallops. Respiratory: Normal respiratory effort without tachypnea nor retractions. Breath sounds are clear and equal bilaterally. No wheezes/rales/rhonchi. Gastrointestinal: Soft and nontender. Normal bowel sounds Musculoskeletal: Nontender with normal range of motion in extremities. No  lower extremity tenderness nor edema. Neurologic:  Normal speech and language. No gross focal neurologic deficits are appreciated.  Skin:  Skin is warm, dry and intact. No rash noted. Psychiatric: Mood and affect are normal. Speech and behavior are normal.  ____________________________________________  EKG: Interpreted by me.  Sinus rhythm the rate of 96 bpm, short PR interval, normal QRS, normal QT  ____________________________________________  ED COURSE:  As part of my medical decision making, I reviewed the following data within the [place] History obtained from family if available, nursing notes, old chart and ekg, as well as notes from prior ED visits. Patient presented for vomiting and weakness, we will assess with labs and imaging as indicated at this time.   Procedures ____________________________________________   LABS (pertinent positives/negatives)  Labs Reviewed  COMPREHENSIVE METABOLIC PANEL - Abnormal; Notable for the following components:      Result Value   Potassium 3.4 (*)    Chloride 96 (*)    Glucose, Bld 204 (*)    BUN 37 (*)    Total Protein 8.3 (*)    Total Bilirubin 1.9 (*)    Anion gap 21 (*)    All other components within normal limits  CBC - Abnormal; Notable for the following components:   WBC 15.4 (*)    RBC 5.52 (*)    Hemoglobin 16.1 (*)    Platelets 502 (*)    All other components within normal limits  URINALYSIS, COMPLETE (UACMP) WITH MICROSCOPIC - Abnormal; Notable for the following components:   Color, Urine YELLOW (*)    APPearance HAZY (*)    Ketones, ur 20 (*)    Protein, ur 30 (*)    Bacteria, UA RARE (*)    All other components within normal limits  LIPASE, BLOOD   ____________________________________________  DIFFERENTIAL DIAGNOSIS   Dehydration, electrolyte abnormality, gastroenteritis, occult infection  FINAL ASSESSMENT AND PLAN  Alcoholic ketoacidosis   Plan: The patient had presented for persistent vomiting. Patient's labs revealed an elevated anion gap likely indicative of alcoholic ketoacidosis.  She has admitted to drinking significant amounts of alcohol in the form of wine daily.  She does not have any improvement in her symptoms and cannot tolerate anything by mouth yet.  I will discuss with the hospitalist for admission.   Laurence Aly, MD   Note: This note was generated in part or whole with voice recognition software. Voice recognition is usually quite accurate but there are transcription errors that can and very often do  occur. I apologize for any typographical errors that were not detected and corrected.     Earleen Newport, MD 11/27/17 [phone]

## 2017-11-27 NOTE — Plan of Care (Signed)
Pt admitted today from the ED. VSS. Denies pain. C/o nausea, phenergan given with improvement. Pt verbalized that nausea improved, yet hesitated to take her meds. Ativan given once for withdrawal symptoms. CIWA score 1.

## 2017-11-27 NOTE — ED Notes (Signed)
Pt reports drinks 3 bottles wine per week. Last drink last Tuesday night

## 2017-11-27 NOTE — ED Triage Notes (Signed)
C/o vomiting/weakness last week, got better and then worse again today.  No pain

## 2017-11-27 NOTE — H&P (Signed)
[place] at [place] NAME: Deanna Haynes    MR#:  [id number]  DATE OF BIRTH:  16-Apr-1965  DATE OF ADMISSION:  11/27/2017  PRIMARY CARE PHYSICIAN: Remi Haggard, FNP   REQUESTING/REFERRING PHYSICIAN: Dr. Lenise Arena  CHIEF COMPLAINT:   Chief Complaint  Patient presents with  . Weakness    HISTORY OF PRESENT ILLNESS:  Deanna Haynes  is a 52 y.o. female with a known history of cervical cancer, anxiety, depression, alcohol abuse who presents to the hospital due to weakness, nausea vomiting and poor p.o. intake ongoing for the past few days.  Patient says that she developed some nausea vomiting and diarrhea last week on Thursday and has progressively gotten worse.  Patient has some friends at bedside who stated the patient does drink quite a bit every day.  She drinks almost a large bottle of wine every couple of days.  She has not been able to keep anything down over the past 4 to 5 days and therefore was brought to the ER for further evaluation.  In the emergency room patient was noted to have alcoholic ketosis with dehydration and therefore hospitalist services were contacted for admission.  Patient presently denies any abdominal pain, chest pains, shortness of breath, fever, chills, cough or any other associated symptoms presently.  PAST MEDICAL HISTORY:   Past Medical History:  Diagnosis Date  . Anxiety   . Cervical cancer [facility])    Radical Hysterectomy 11/26/14 with adjuvant chemoradiation  . Vaginal cuff dehiscence     PAST SURGICAL HISTORY:   Past Surgical History:  Procedure Laterality Date  . RADICAL HYSTERECTOMY  11/26/2014   Stage IB2 Cervical Cancer, adjuvant chemoradiation  . REPAIR VAGINAL CUFF      SOCIAL HISTORY:   Social History   Tobacco Use  . Smoking status: Former Smoker    Packs/day: 1.00    Years: 20.00    Pack years: 20.00    Types: Cigarettes    Last attempt to quit: 11/01/2014    Years since  quitting: 3.0  . Smokeless tobacco: Never Used  Substance Use Topics  . Alcohol use: Yes    Comment:  3 bottles a wine a week.     FAMILY HISTORY:   Family History  Problem Relation Age of Onset  . Anxiety disorder Mother   . Cervical cancer Mother     DRUG ALLERGIES:   Allergies  Allergen Reactions  . Codeine Nausea And Vomiting    REVIEW OF SYSTEMS:   Review of Systems  Constitutional: Negative for fever and weight loss.  HENT: Negative for congestion, nosebleeds and tinnitus.   Eyes: Negative for blurred vision, double vision and redness.  Respiratory: Negative for cough, hemoptysis and shortness of breath.   Cardiovascular: Negative for chest pain, orthopnea, leg swelling and PND.  Gastrointestinal: Positive for heartburn, nausea and vomiting. Negative for abdominal pain, diarrhea and melena.  Genitourinary: Negative for dysuria, hematuria and urgency.  Musculoskeletal: Negative for falls and joint pain.  Neurological: Negative for dizziness, tingling, sensory change, focal weakness, seizures, weakness and headaches.  Endo/Heme/Allergies: Negative for polydipsia. Does not bruise/bleed easily.  Psychiatric/Behavioral: Negative for depression and memory loss. The patient is not nervous/anxious.     MEDICATIONS AT HOME:   Prior to Admission medications   Medication Sig Start Date End Date Taking? Authorizing Provider  clonazePAM (KLONOPIN) 0.5 MG tablet Take 0.5 mg by mouth daily.   Yes [provider]  ferrous sulfate  325 (65 FE) MG tablet Take 325 mg by mouth daily with breakfast.   Yes [provider]  Multiple Vitamins-Minerals (MULTIVITAMIN WITH MINERALS) tablet Take 1 tablet by mouth daily.   Yes [provider]  promethazine (PHENERGAN) 25 MG tablet Take 25 mg by mouth every 6 (six) hours as needed for nausea/vomiting. 11/26/17  Yes [provider]  rOPINIRole (REQUIP) 0.5 MG tablet Take 0.5 mg by mouth at bedtime. 11/14/17  Yes  [provider]  sertraline (ZOLOFT) 100 MG tablet Take 100 mg by mouth at bedtime. 11/14/17  Yes [provider]  traZODone (DESYREL) 50 MG tablet Take 50 mg by mouth at bedtime.   Yes [provider]  vitamin B-12 (CYANOCOBALAMIN) 100 MCG tablet Take 100 mcg by mouth daily.   Yes [provider]  vitamin C (ASCORBIC ACID) 500 MG tablet Take 500 mg by mouth 2 (two) times daily.   Yes [provider]      VITAL SIGNS:  Blood pressure (!) 162/78, pulse 85, temperature (!) 97.5 F (36.4 C), temperature source Oral, resp. rate (!) 28, height 5\' 4"  (1.626 m), weight 72.6 kg, SpO2 99 %.  PHYSICAL EXAMINATION:  Physical Exam  GENERAL:  52 y.o.-year-old patient lying in the bed in no acute distress.  EYES: Pupils equal, round, reactive to light and accommodation. No scleral icterus. Extraocular muscles intact.  HEENT: Head atraumatic, normocephalic. Oropharynx and nasopharynx clear. No oropharyngeal erythema, moist oral mucosa  NECK:  Supple, no jugular venous distention. No thyroid enlargement, no tenderness.  LUNGS: Normal breath sounds bilaterally, no wheezing, rales, rhonchi. No use of accessory muscles of respiration.  CARDIOVASCULAR: S1, S2 RRR. No murmurs, rubs, gallops, clicks.  ABDOMEN: Soft, nontender, nondistended. Bowel sounds present. No organomegaly or mass.  EXTREMITIES: No pedal edema, cyanosis, or clubbing. + 2 pedal & radial pulses b/l.   NEUROLOGIC: Cranial nerves II through XII are intact. No focal Motor or sensory deficits appreciated b/l PSYCHIATRIC: The patient is alert and oriented x 3.   SKIN: No obvious rash, lesion, or ulcer.   LABORATORY PANEL:   CBC Recent Labs  Lab 11/27/17 1145  WBC 15.4*  HGB 16.1*  HCT 46.5  PLT 502*   ------------------------------------------------------------------------------------------------------------------  Chemistries  Recent Labs  Lab 11/27/17 1145  NA 139  K 3.4*  CL 96*   CO2 22  GLUCOSE 204*  BUN 37*  CREATININE 0.97  CALCIUM 10.1  AST 16  ALT 11  ALKPHOS 73  BILITOT 1.9*   ------------------------------------------------------------------------------------------------------------------  Cardiac Enzymes No results for input(s): TROPONINI in the last 168 hours. ------------------------------------------------------------------------------------------------------------------  RADIOLOGY:  No results found.   IMPRESSION AND PLAN:   52 year old female with past medical history of anxiety, cervical cancer, alcohol abuse who presents to the hospital due to nausea vomiting and poor p.o. intake and noted to have alcoholic ketosis.  1.  Intractable nausea vomiting-secondary to alcohol abuse/alcoholic ketosis. - Treat patient supportively with IV fluids, antiemetics. -Follow clinically.  2.  Alcohol abuse-patient is at high risk for alcohol withdrawal.   -Continue thiamine, folate, will place on CIWA protocol.  3.  Hyperglycemia-patient's random blood sugar is greater than 200. - Patient has no previous history of diabetes.  I will check a hemoglobin A 1C and follow blood sugars for now.  4.  Anxiety/depression-continue Zoloft, trazodone, Klonopin. - We will get psychiatric consult to help with medical management.  5.  Anemia of chronic disease-continue iron supplements.  6.  Restless leg syndrome-continue Requip.  All the records are reviewed and case discussed with ED provider. Management plans discussed with the patient, family and they are in agreement.  CODE STATUS: Full code  TOTAL TIME TAKING CARE OF THIS PATIENT: 40 minutes.    Henreitta Leber M.D on 11/27/2017 at 2:24 PM  Between 7am to 6pm - Pager - [phone]  After 6pm go to [email or web address] - password [email or web address] [place] [facility]  Office  [phone]  CC: Primary care physician; Remi Haggard, FNP

## 2017-11-28 LAB — CBC
HCT: 39.4 % (ref 35.0–47.0)
HEMOGLOBIN: 13.3 g/dL (ref 12.0–16.0)
MCH: 28.7 pg (ref 26.0–34.0)
MCHC: 33.8 g/dL (ref 32.0–36.0)
MCV: 84.9 fL (ref 80.0–100.0)
PLATELETS: 333 10*3/uL (ref 150–440)
RBC: 4.63 MIL/uL (ref 3.80–5.20)
RDW: 13.3 % (ref 11.5–14.5)
WBC: 12.2 10*3/uL — ABNORMAL HIGH (ref 3.6–11.0)

## 2017-11-28 LAB — BASIC METABOLIC PANEL
ANION GAP: 11 (ref 5–15)
BUN: 29 mg/dL — ABNORMAL HIGH (ref 6–20)
CHLORIDE: 105 mmol/L (ref 98–111)
CO2: 24 mmol/L (ref 22–32)
Calcium: 8.5 mg/dL — ABNORMAL LOW (ref 8.9–10.3)
Creatinine, Ser: 0.6 mg/dL (ref 0.44–1.00)
GFR calc non Af Amer: >60 mL/min (ref >60)
GLUCOSE: 152 mg/dL — AB (ref 70–99)
Potassium: 3.1 mmol/L — ABNORMAL LOW (ref 3.5–5.1)
Sodium: 140 mmol/L (ref 135–145)

## 2017-11-28 LAB — HEMOGLOBIN A1C
Hgb A1c MFr Bld: 5.4 % (ref 4.8–5.6)
MEAN PLASMA GLUCOSE: 108.28 mg/dL

## 2017-11-28 LAB — MAGNESIUM: MAGNESIUM: 2 mg/dL (ref 1.7–2.4)

## 2017-11-28 MED ORDER — PANTOPRAZOLE SODIUM 40 MG PO TBEC: 40.0000 mg | DELAYED_RELEASE_TABLET | Freq: Two times a day (BID) | ORAL | 0 refills | Status: AC

## 2017-11-28 MED ORDER — ALUM & MAG HYDROXIDE-SIMETH 200-200-20 MG/5ML PO SUSP
30.0000 mL | ORAL | Status: DC | PRN
  Administered 2017-11-28 (×2): 30 mL via ORAL
  Filled 2017-11-28 (×2): qty 30

## 2017-11-28 MED ORDER — POTASSIUM CHLORIDE IN NACL 20-0.9 MEQ/L-% IV SOLN
INTRAVENOUS | Status: DC
  Administered 2017-11-28 – 2017-11-29 (×2): via INTRAVENOUS
  Filled 2017-11-28 (×4): qty 1000

## 2017-11-28 MED ORDER — ALUM & MAG HYDROXIDE-SIMETH 200-200-20 MG/5ML PO SUSP: 30.0000 mL | ORAL | 0 refills | Status: AC | PRN

## 2017-11-28 MED ORDER — PANTOPRAZOLE SODIUM 40 MG PO TBEC
40.0000 mg | DELAYED_RELEASE_TABLET | Freq: Two times a day (BID) | ORAL | Status: DC
  Administered 2017-11-28 (×2): 40 mg via ORAL
  Filled 2017-11-28 (×3): qty 1

## 2017-11-28 MED ORDER — POTASSIUM CHLORIDE CRYS ER 20 MEQ PO TBCR
60.0000 meq | EXTENDED_RELEASE_TABLET | Freq: Once | ORAL | Status: AC
  Administered 2017-11-28: 60 meq via ORAL
  Filled 2017-11-28: qty 3

## 2017-11-28 NOTE — Progress Notes (Signed)
Contacted by patient's RN [name].Stated family,patient upset about discharge.I spoke with Dr. Mancel Bale family was unhappy with discharge plans and requested he speak with them about their concerns.Dr. Bridgett Larsson stated he could not change her to an inpatient status, but she could stay overnight if she wanted to but would probably not be covered.He stated he had talked with [facility] and Care manager. I requested he speak with patient and family since the issue had been discussed by RN, and care manager since the family/patient had issues with discharge.

## 2017-11-28 NOTE — Progress Notes (Signed)
   11/28/17 1500  Clinical Encounter Type  Visited With Patient;Health care provider  Visit Type Initial  Referral From Nurse  Consult/Referral To Chaplain  Spiritual Encounters  Spiritual Needs Brochure;Other (Comment)  Author entered room and witnessed patient ambulating gingerly around room to bathroom. Patient was in bathroom for several minutes during visit. This Pryor Curia engaged in small talk with friend of patient. Was informed patient was interested in Advanced Directive. Author found two volunteers to serve as witnesses. Received help from unit clerk to assist with notary public. CH Mullins assisted this Chief Strategy Officer. Pastoral visit was appreciated.

## 2017-11-28 NOTE — Progress Notes (Signed)
Deanna Haynes at [place] was admitted to the Hospital on 11/27/2017 and Discharged  11/28/2017 and should be excused from work/school   for 5  days starting 11/27/2017 , may return to work/school without any restrictions.  Demetrios Loll M.D on 11/28/2017,at 4:04 PM  [place] [facility]  [phone]

## 2017-11-28 NOTE — Plan of Care (Signed)
Pt c/o nausea. No emesis. Nausea prn meds given with improvement .Pt c/o heartburn during the shift. Maalox and Protonix given with no improvement.   Pt unable to eat and feels week. IVF initiated, clear liquid diet ordered. GI consult pending.

## 2017-11-28 NOTE — Progress Notes (Signed)
[place] at [place] NAME: Deanna Haynes    MR#:  [id number]  DATE OF BIRTH:  September 22, 1965  SUBJECTIVE:  CHIEF COMPLAINT:   Chief Complaint  Patient presents with  . Weakness   Nausea and vomiting improved.  But patient still complains of heartburn and cannot tolerate diet. REVIEW OF SYSTEMS:  Review of Systems  Constitutional: Positive for malaise/fatigue. Negative for chills and fever.  HENT: Negative for sore throat.   Eyes: Negative for blurred vision and double vision.  Respiratory: Negative for cough, hemoptysis, shortness of breath, wheezing and stridor.   Cardiovascular: Negative for chest pain, palpitations, orthopnea and leg swelling.  Gastrointestinal: Positive for heartburn. Negative for abdominal pain, blood in stool, diarrhea, melena, nausea and vomiting.  Genitourinary: Negative for dysuria, flank pain and hematuria.  Musculoskeletal: Negative for back pain and joint pain.  Skin: Negative for rash.  Neurological: Negative for dizziness, sensory change, focal weakness, seizures, loss of consciousness, weakness and headaches.  Endo/Heme/Allergies: Negative for polydipsia.  Psychiatric/Behavioral: Negative for depression. The patient is not nervous/anxious.     DRUG ALLERGIES:   Allergies  Allergen Reactions  . Codeine Nausea And Vomiting   VITALS:  Blood pressure (!) 151/92, pulse 83, temperature 98.2 F (36.8 C), temperature source Oral, resp. rate 18, height 5\' 4"  (1.626 m), weight 72.6 kg, SpO2 95 %. PHYSICAL EXAMINATION:  Physical Exam  Constitutional: She is oriented to person, place, and time.  HENT:  Head: Normocephalic.  Mouth/Throat: Oropharynx is clear and moist.  Eyes: Pupils are equal, round, and reactive to light. Conjunctivae and EOM are normal. No scleral icterus.  Neck: Normal range of motion. Neck supple. No JVD present. No tracheal deviation present.  Cardiovascular: Normal rate, regular rhythm  and normal heart sounds. Exam reveals no gallop.  No murmur heard. Pulmonary/Chest: Effort normal and breath sounds normal. No respiratory distress. She has no wheezes. She has no rales.  Abdominal: Soft. Bowel sounds are normal. She exhibits no distension. There is no tenderness. There is no rebound.  Musculoskeletal: Normal range of motion. She exhibits no edema or tenderness.  Neurological: She is alert and oriented to person, place, and time. No cranial nerve deficit.  Skin: No rash noted. No erythema.   LABORATORY PANEL:  Female CBC Recent Labs  Lab 11/28/17 0341  WBC 12.2*  HGB 13.3  HCT 39.4  PLT 333   ------------------------------------------------------------------------------------------------------------------ Chemistries  Recent Labs  Lab 11/27/17 1145 11/28/17 0341  NA 139 140  K 3.4* 3.1*  CL 96* 105  CO2 22 24  GLUCOSE 204* 152*  BUN 37* 29*  CREATININE 0.97 0.60  CALCIUM 10.1 8.5*  MG  --  2.0  AST 16  --   ALT 11  --   ALKPHOS 73  --   BILITOT 1.9*  --    RADIOLOGY:  No results found. ASSESSMENT AND PLAN:  52 year old female with past medical history of anxiety, cervical cancer, alcohol abuse who presents to the hospital due to nausea vomiting and poor p.o. intake and noted to have alcoholic ketosis.  1. Intractable nausea vomiting-secondary to alcohol abuse/alcoholic ketosis. Improved with with IV fluids, antiemetics.  Acid reflux. Started Maalox and Protonix twice daily.  2. Alcohol abuse-patient is at high risk for alcohol withdrawal.  She is on CIWA protocol.  No signs of withdrawal so far.  3. Hyperglycemia-patient's random blood sugar is greater than 200.  hemoglobin A 1C 5.4.  4. Anxiety/depression-continue Zoloft, trazodone, Klonopin.  She can continue her Zoloft trazodone and modest dose of clonazepam if she can stomach it. No need for psychiatric hospitalization per Dr. Weber Cooks.  5. Anemia of chronic disease-continue iron  supplements.  Hemoglobin is normal.  6. Restless leg syndrome-continue Requip.  Hypokalemia.  Given potassium supplement.  Magnesium is normal.  Discussed the patient's sister, POA, who is upset about observation status.  She said the patient cannot go home because she has had a pleural intake for 1 week and generalized weakness. I discussed with nurse supervisor. All the records are reviewed and case discussed with Care Management/Social Worker. Management plans discussed with the patient, her POA and they are in agreement.  CODE STATUS: Full Code  TOTAL TIME TAKING CARE OF THIS PATIENT: 42 minutes.   More than 50% of the time was spent in counseling/coordination of care: YES  POSSIBLE D/C IN 1 DAYS, DEPENDING ON CLINICAL CONDITION.   Demetrios Loll M.D on 11/28/2017 at 4:49 PM  Between 7am to 6pm - Pager - [phone]  After 6pm go to [email or web address] - [place] [facility]

## 2017-11-29 DIAGNOSIS — K219 Gastro-esophageal reflux disease without esophagitis: Secondary | ICD-10-CM | POA: Diagnosis not present

## 2017-11-29 DIAGNOSIS — R112 Nausea with vomiting, unspecified: Secondary | ICD-10-CM | POA: Diagnosis not present

## 2017-11-29 DIAGNOSIS — F1010 Alcohol abuse, uncomplicated: Secondary | ICD-10-CM | POA: Diagnosis not present

## 2017-11-29 LAB — BILIRUBIN, TOTAL: BILIRUBIN TOTAL: 1.1 mg/dL (ref 0.3–1.2)

## 2017-11-29 LAB — BASIC METABOLIC PANEL
Anion gap: 12 (ref 5–15)
BUN: 29 mg/dL — ABNORMAL HIGH (ref 6–20)
CALCIUM: 8.7 mg/dL — AB (ref 8.9–10.3)
CO2: 29 mmol/L (ref 22–32)
Chloride: 101 mmol/L (ref 98–111)
Creatinine, Ser: 0.49 mg/dL (ref 0.44–1.00)
Glucose, Bld: 144 mg/dL — ABNORMAL HIGH (ref 70–99)
Potassium: 3.5 mmol/L (ref 3.5–5.1)
SODIUM: 142 mmol/L (ref 135–145)

## 2017-11-29 LAB — HIV ANTIBODY (ROUTINE TESTING W REFLEX): HIV SCREEN 4TH GENERATION: NONREACTIVE

## 2017-11-29 MED ORDER — METOPROLOL TARTRATE 25 MG PO TABS
25.0000 mg | ORAL_TABLET | Freq: Two times a day (BID) | ORAL | Status: DC
  Filled 2017-11-29: qty 1

## 2017-11-29 MED ORDER — METOPROLOL TARTRATE 25 MG PO TABS: 25.0000 mg | ORAL_TABLET | Freq: Two times a day (BID) | ORAL | 0 refills | Status: AC

## 2017-11-29 NOTE — Consult Note (Signed)
[facility] Consult   Reason for Consult: Follow-up patient came into the hospital with confusion probably largely related to increased alcohol withdrawal and alcoholic ketoacidosis Referring Physician: Verdell Carmine Patient Identification: Deanna Haynes MRN:  [id number] Principal Diagnosis: Alcohol abuse Diagnosis:   Patient Active Problem List   Diagnosis Date Noted  . Intractable vomiting with nausea [R11.2]   . Gastroesophageal reflux disease [K21.9]   . 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 Alcohol abuse [F10.10] 11/27/2017  . Generalized anxiety disorder [F41.1] 11/27/2017  . Dehiscence of incision [T81.31XA] 02/01/2015  . Anxiety [F41.9] 12/03/2014  . Lump in thyroid [E07.9] 11/25/2014  . Malignant neoplasm of cervix ([facility]) [C53.9] 11/24/2014  . Cervical cancer ([facility]) [C53.9] 11/17/2014    Total Time spent with patient: 20 minutes  Subjective:   Deanna Haynes is a 52 y.o. female patient admitted with "I am feeling much better.Marland Kitchen  HPI: Patient reports that she is feeling significantly better.  She is eating better and has been able to ambulate without difficulty.  Mood is stable.  No sign of being acutely depressed.  Denies suicidal thoughts denies any psychosis.  Insight is improved.  Past Psychiatric History: History of chronic anxiety made worse by her cancer.  Recent alcohol abuse as a compensation for that has developed a life of its own.  No history of suicidality.  Risk to Self:   Risk to Others:   Prior Inpatient Therapy:   Prior Outpatient Therapy:    Past Medical History:  Past Medical History:  Diagnosis Date  . Anxiety   . Cervical cancer [facility])    Radical Hysterectomy 11/26/14 with adjuvant chemoradiation  . Vaginal cuff dehiscence     Past Surgical History:  Procedure Laterality Date  . RADICAL HYSTERECTOMY  11/26/2014   Stage IB2 Cervical Cancer, adjuvant chemoradiation  . REPAIR VAGINAL CUFF     Family History:  Family History   Problem Relation Age of Onset  . Anxiety disorder Mother   . Cervical cancer Mother    Family Psychiatric  History: See previous note Social History:  Social History   Substance and Sexual Activity  Alcohol Use Yes   Comment:  3 bottles a wine a week.      Social History   Substance and Sexual Activity  Drug Use No    Social History   Socioeconomic History  . Marital status: Single    Spouse name: Not on file  . Number of children: Not on file  . Years of education: Not on file  . Highest education level: Not on file  Occupational History  . Occupation: Quarry manager  Social Needs  . Financial resource strain: Not on file  . Food insecurity:    Worry: Not on file    Inability: Not on file  . Transportation needs:    Medical: Not on file    Non-medical: Not on file  Tobacco Use  . Smoking status: Former Smoker    Packs/day: 1.00    Years: 20.00    Pack years: 20.00    Types: Cigarettes    Last attempt to quit: 11/01/2014    Years since quitting: 3.0  . Smokeless tobacco: Never Used  Substance and Sexual Activity  . Alcohol use: Yes    Comment:  3 bottles a wine a week.   . Drug use: No  . Sexual activity: Not on file  Lifestyle  . Physical activity:    Days per week: Not on file    Minutes per  session: Not on file  . Stress: Not on file  Relationships  . Social connections:    Talks on phone: Not on file    Gets together: Not on file    Attends religious service: Not on file    Active member of club or organization: Not on file    Attends meetings of clubs or organizations: Not on file    Relationship status: Not on file  Other Topics Concern  . Not on file  Social History Narrative  . Not on file   Additional Social History:    Allergies:   Allergies  Allergen Reactions  . Codeine Nausea And Vomiting    Labs:  Results for orders placed or performed during the hospital encounter of 11/27/17 (from the past 48 hour(s))  HIV antibody (Routine  Testing)     Status: None   Collection Time: 11/28/17  3:41 AM  Result Value Ref Range   HIV Screen 4th Generation wRfx Non Reactive Non Reactive    Comment: (NOTE) Performed At: [facility] [place], [place] [id number] Rush Farmer MD GO:[id number]   Basic metabolic panel     Status: Abnormal   Collection Time: 11/28/17  3:41 AM  Result Value Ref Range   Sodium 140 135 - 145 mmol/L   Potassium 3.1 (L) 3.5 - 5.1 mmol/L   Chloride 105 98 - 111 mmol/L   CO2 24 22 - 32 mmol/L   Glucose, Bld 152 (H) 70 - 99 mg/dL   BUN 29 (H) 6 - 20 mg/dL   Creatinine, Ser 0.60 0.44 - 1.00 mg/dL   Calcium 8.5 (L) 8.9 - 10.3 mg/dL   GFR calc non Af Amer >60 >60 mL/min   GFR calc Af Amer >60 >60 mL/min    Comment: (NOTE) The eGFR has been calculated using the CKD EPI equation. This calculation has not been validated in all clinical situations. eGFR's persistently <60 mL/min signify possible Chronic Kidney Disease.    Anion gap 11 5 - 15    Comment: Performed at [facility], [address] [place], [place] [address]  CBC     Status: Abnormal   Collection Time: 11/28/17  3:41 AM  Result Value Ref Range   WBC 12.2 (H) 3.6 - 11.0 K/uL   RBC 4.63 3.80 - 5.20 MIL/uL   Hemoglobin 13.3 12.0 - 16.0 g/dL   HCT 39.4 35.0 - 47.0 %   MCV 84.9 80.0 - 100.0 fL   MCH 28.7 26.0 - 34.0 pg   MCHC 33.8 32.0 - 36.0 g/dL   RDW 13.3 11.5 - 14.5 %   Platelets 333 150 - 440 K/uL    Comment: Performed at [facility], [address], [place], [place] [address]  Magnesium     Status: None   Collection Time: 11/28/17  3:41 AM  Result Value Ref Range   Magnesium 2.0 1.7 - 2.4 mg/dL    Comment: Performed at [facility], [address] [place], [place] [address]  Hemoglobin A1c     Status: None   Collection Time: 11/28/17  3:41 AM  Result Value Ref Range   Hgb A1c MFr Bld 5.4 4.8 - 5.6 %    Comment: (NOTE) Pre diabetes:          5.7%-6.4% Diabetes:               >6.4% Glycemic control for   <7.0% adults with diabetes    Mean Plasma Glucose 108.28  mg/dL    Comment: Performed at [facility], [place] [address] [place], [place] [address]  Basic metabolic panel     Status: Abnormal   Collection Time: 11/29/17  4:24 AM  Result Value Ref Range   Sodium 142 135 - 145 mmol/L   Potassium 3.5 3.5 - 5.1 mmol/L   Chloride 101 98 - 111 mmol/L   CO2 29 22 - 32 mmol/L   Glucose, Bld 144 (H) 70 - 99 mg/dL   BUN 29 (H) 6 - 20 mg/dL   Creatinine, Ser 0.49 0.44 - 1.00 mg/dL   Calcium 8.7 (L) 8.9 - 10.3 mg/dL   GFR calc non Af Amer >60 >60 mL/min   GFR calc Af Amer >60 >60 mL/min    Comment: (NOTE) The eGFR has been calculated using the CKD EPI equation. This calculation has not been validated in all clinical situations. eGFR's persistently <60 mL/min signify possible Chronic Kidney Disease.    Anion gap 12 5 - 15    Comment: Performed at [facility], [address] [place], [place] [address]  Bilirubin, total     Status: None   Collection Time: 11/29/17  4:24 AM  Result Value Ref Range   Total Bilirubin 1.1 0.3 - 1.2 mg/dL    Comment: Performed at [facility], [address] [place], [place] [address]    Current Facility-Administered Medications  Medication Dose Route Frequency Provider Last Rate Last Dose  . acetaminophen (TYLENOL) tablet 650 mg  650 mg Oral Q6H PRN Henreitta Leber, MD   650 mg at 11/28/17 2010   Or  . acetaminophen (TYLENOL) suppository 650 mg  650 mg Rectal Q6H PRN Henreitta Leber, MD      . alum & mag hydroxide-simeth (MAALOX/MYLANTA) 200-200-20 MG/5ML suspension 30 mL  30 mL Oral Q4H PRN Demetrios Loll, MD   30 mL at 11/28/17 1606  . clonazePAM (KLONOPIN) tablet 0.5 mg  0.5 mg Oral Daily Henreitta Leber, MD   0.5 mg at 11/28/17 1038  . enoxaparin (LOVENOX) injection 40 mg  40 mg Subcutaneous Q24H Sainani, Vivek J, MD      . ferrous sulfate tablet 325 mg  325 mg Oral Q breakfast Sainani, Belia Heman, MD       . folic acid (FOLVITE) tablet 1 mg  1 mg Oral Daily Sainani, Belia Heman, MD      . LORazepam (ATIVAN) tablet 1 mg  1 mg Oral Q6H PRN Henreitta Leber, MD   1 mg at 11/28/17 0154   Or  . LORazepam (ATIVAN) injection 1 mg  1 mg Intravenous Q6H PRN Henreitta Leber, MD   1 mg at 11/28/17 1904  . metoprolol tartrate (LOPRESSOR) tablet 25 mg  25 mg Oral BID Demetrios Loll, MD      . multivitamin with minerals tablet 1 tablet  1 tablet Oral Daily Sainani, Belia Heman, MD      . ondansetron [facility]) tablet 4 mg  4 mg Oral Q6H PRN Henreitta Leber, MD   4 mg at 11/28/17 1606   Or  . ondansetron (ZOFRAN) injection 4 mg  4 mg Intravenous Q6H PRN Henreitta Leber, MD   4 mg at 11/29/17 1130  . pantoprazole (PROTONIX) EC tablet 40 mg  40 mg Oral BID Otho Perl, MD   40 mg at 11/28/17 1606  . pneumococcal 23 valent vaccine (PNU-IMMUNE) injection 0.5 mL  0.5 mL Intramuscular Tomorrow-1000 Henreitta Leber, MD      .  promethazine (PHENERGAN) injection 25 mg  25 mg Intravenous Q6H PRN Henreitta Leber, MD   25 mg at 11/28/17 1032  . rOPINIRole (REQUIP) tablet 0.5 mg  0.5 mg Oral QHS Henreitta Leber, MD   0.5 mg at 11/28/17 2010  . sertraline (ZOLOFT) tablet 100 mg  100 mg Oral QHS Henreitta Leber, MD   100 mg at 11/28/17 2010  . thiamine (VITAMIN B-1) tablet 100 mg  100 mg Oral Daily Sainani, Belia Heman, MD       Or  . thiamine (B-1) injection 100 mg  100 mg Intravenous Daily Henreitta Leber, MD   100 mg at 11/29/17 1144  . traZODone (DESYREL) tablet 50 mg  50 mg Oral QHS Henreitta Leber, MD   50 mg at 11/28/17 2011  . vitamin B-12 (CYANOCOBALAMIN) tablet 100 mcg  100 mcg Oral Daily Sainani, Belia Heman, MD      . vitamin C (ASCORBIC ACID) tablet 500 mg  500 mg Oral BID Henreitta Leber, MD        Musculoskeletal: Strength & Muscle Tone: within normal limits Gait & Station: normal Patient leans: N/A  Psychiatric Specialty Exam: Physical Exam  Nursing note and vitals reviewed. Constitutional: She appears  well-developed and well-nourished.  HENT:  Head: Normocephalic and atraumatic.  Eyes: Pupils are equal, round, and reactive to light. Conjunctivae are normal.  Neck: Normal range of motion.  Cardiovascular: Regular rhythm and normal heart sounds.  Respiratory: Effort normal.  GI: Soft.  Musculoskeletal: Normal range of motion.  Neurological: She is alert.  Skin: Skin is warm and dry.  Psychiatric: Judgment normal. Her affect is blunt. Her speech is delayed. She is not agitated and not aggressive. Thought content is not paranoid. Cognition and memory are normal. She expresses no homicidal and no suicidal ideation.    Review of Systems  Constitutional: Negative.   HENT: Negative.   Eyes: Negative.   Respiratory: Negative.   Cardiovascular: Negative.   Gastrointestinal: Negative.   Musculoskeletal: Negative.   Skin: Negative.   Neurological: Negative.   Psychiatric/Behavioral: Negative.     Blood pressure (!) 154/96, pulse 93, temperature 98.6 F (37 C), temperature source Oral, resp. rate 20, height _0  (1.626 m), weight 72.6 kg, SpO2 94 %.Body mass index is 27.46 kg/m.  General Appearance: Casual  Eye Contact:  Fair  Speech:  Normal Rate  Volume:  Normal  Mood:  Euthymic  Affect:  Congruent  Thought Process:  Goal Directed  Orientation:  Full (Time, Place, and Person)  Thought Content:  Logical  Suicidal Thoughts:  No  Homicidal Thoughts:  No  Memory:  Immediate;   Fair Recent;   Fair Remote;   Fair  Judgement:  Fair  Insight:  Fair  Psychomotor Activity:  Normal  Concentration:  Concentration: Fair  Recall:  [employer] of Knowledge:  Fair  Language:  Fair  Akathisia:  No  Handed:  Right  AIMS (if indicated):     Assets:  Desire for Improvement Housing Resilience  ADL's:  Intact  Cognition:  WNL  Sleep:        Treatment Plan Summary: Plan Patient appears to have stabilized and is no longer having any alcohol withdrawal and is calm and lucid.  Supportive  counseling completed.  Strongly encourage patient to get into substance abuse treatment and stop her alcohol use.  Patient is agreeable to plan.  No change to medication.  Disposition: Patient does not meet criteria for psychiatric  inpatient admission. Supportive therapy provided about ongoing stressors. Discussed crisis plan, support from social network, calling 911, coming to the Emergency Department, and calling Suicide Hotline.  Alethia Berthold, MD 11/29/2017 3:53 PM

## 2017-11-29 NOTE — Progress Notes (Signed)
Pt for discharge home. Alert. No distress. Dr Allen Norris saw pt and he said pt can go home.  Pt refused anything to be done.  Sl dc/d.  Pt placed on soft diet by dr Allen Norris.   tol well. Instructions discussed with pt . meds / diet / activity and f/u. Verbalize understanding of discharge.

## 2017-11-29 NOTE — Care Management (Signed)
Case discuss with attending. GI consult today. If no intervention, will discharge. Patient will remain observation. Will speak further with patient once GI consult complete.

## 2017-11-29 NOTE — Discharge Summary (Signed)
[facility] NAME: Deanna Haynes    MR#:  [id number]  DATE OF BIRTH:  1966/03/15  DATE OF ADMISSION:  11/27/2017   ADMITTING PHYSICIAN: Henreitta Leber, MD  DATE OF DISCHARGE: 11/29/2017  PRIMARY CARE PHYSICIAN: Remi Haggard, FNP   ADMISSION DIAGNOSIS:  Alcoholic ketoacidosis [W09.8] DISCHARGE DIAGNOSIS:  Principal Problem:   Alcohol abuse Active Problems:   Alcoholic ketosis   Generalized anxiety disorder   Intractable vomiting with nausea   Gastroesophageal reflux disease  SECONDARY DIAGNOSIS:   Past Medical History:  Diagnosis Date  . Anxiety   . Cervical cancer [facility])    Radical Hysterectomy 11/26/14 with adjuvant chemoradiation  . Vaginal cuff dehiscence    HOSPITAL COURSE:  52 year old female with past medical history of anxiety, cervical cancer, alcohol abuse who presents to the hospital due to nausea vomiting and poor p.o. intake and noted to have alcoholic ketosis.  1. Intractable nausea vomiting-secondary to alcohol abuse/alcoholic ketosis. Improved withwith IV fluids, antiemetics.  Acid reflux.Started Maalox and Protonix twice daily. Per GI Dr. Allen Norris, EGD today but the patient does not want EGD. The patient should be started on a daily PPI for her heartburn and stop alcohol abuse per Dr. Allen Norris.  2. Alcohol abuse-patient is at high risk for alcohol withdrawal. She ison CIWA protocol.No signs of withdrawal so far.  3. Hyperglycemia-patient's random blood sugar was greater than 200. hemoglobin A 1C 5.4.  4. Anxiety/depression-continue Zoloft, trazodone, Klonopin. She can continue her Zoloft trazodone and modest dose of clonazepam if she can stomach it. No need for psychiatric hospitalizationper Dr. Weber Cooks.  5. Anemia of chronic disease-continue iron supplements.  Hemoglobin is normal.  6. Restless leg syndrome-continue Requip.  Hypokalemia. Improved with potassium  supplement. Magnesium is normal. Hypertension.  The patient has no history of hypertension.  But blood pressure has been elevated during the hospitalization.  Started Lopressor twice daily.  Follow-up PCP for dose adjustment. DISCHARGE CONDITIONS:  Stable, discharge to home today. CONSULTS OBTAINED:  Treatment Team:  Clapacs, Madie Reno, MD Lin Landsman, MD DRUG ALLERGIES:   Allergies  Allergen Reactions  . Codeine Nausea And Vomiting   DISCHARGE MEDICATIONS:   Allergies as of 11/29/2017      Reactions   Codeine Nausea And Vomiting      Medication List    STOP taking these medications   vitamin C 500 MG tablet Commonly known as:  ASCORBIC ACID     TAKE these medications   alum & mag hydroxide-simeth 200-200-20 MG/5ML suspension Commonly known as:  MAALOX/MYLANTA Take 30 mLs by mouth every 4 (four) hours as needed for indigestion or heartburn.   clonazePAM 0.5 MG tablet Commonly known as:  KLONOPIN Take 0.5 mg by mouth daily.   ferrous sulfate 325 (65 FE) MG tablet Take 325 mg by mouth daily with breakfast.   metoprolol tartrate 25 MG tablet Commonly known as:  LOPRESSOR Take 1 tablet (25 mg total) by mouth 2 (two) times daily.   multivitamin with minerals tablet Take 1 tablet by mouth daily.   pantoprazole 40 MG tablet Commonly known as:  PROTONIX Take 1 tablet (40 mg total) by mouth 2 (two) times daily before a meal.   promethazine 25 MG tablet Commonly known as:  PHENERGAN Take 25 mg by mouth every 6 (six) hours as needed for nausea/vomiting.   rOPINIRole 0.5 MG tablet Commonly known as:  REQUIP Take 0.5 mg by mouth at bedtime.  sertraline 100 MG tablet Commonly known as:  ZOLOFT Take 100 mg by mouth at bedtime.   traZODone 50 MG tablet Commonly known as:  DESYREL Take 50 mg by mouth at bedtime.   vitamin B-12 100 MCG tablet Commonly known as:  CYANOCOBALAMIN Take 100 mcg by mouth daily.        DISCHARGE INSTRUCTIONS:  See AVS.  If  you experience worsening of your admission symptoms, develop shortness of breath, life threatening emergency, suicidal or homicidal thoughts you must seek medical attention immediately by calling 911 or calling your MD immediately  if symptoms less severe.  You Must read complete instructions/literature along with all the possible adverse reactions/side effects for all the Medicines you take and that have been prescribed to you. Take any new Medicines after you have completely understood and accpet all the possible adverse reactions/side effects.   Please note  You were cared for by a hospitalist during your hospital stay. If you have any questions about your discharge medications or the care you received while you were in the hospital after you are discharged, you can call the unit and asked to speak with the hospitalist on call if the hospitalist that took care of you is not available. Once you are discharged, your primary care physician will handle any further medical issues. Please note that NO REFILLS for any discharge medications will be authorized once you are discharged, as it is imperative that you return to your primary care physician (or establish a relationship with a primary care physician if you do not have one) for your aftercare needs so that they can reassess your need for medications and monitor your lab values.    On the day of Discharge:  VITAL SIGNS:  Blood pressure (!) 154/96, pulse 93, temperature 98.6 F (37 C), temperature source Oral, resp. rate 20, height 5\' 4"  (1.626 m), weight 72.6 kg, SpO2 94 %. PHYSICAL EXAMINATION:  GENERAL:  52 y.o.-year-old patient lying in the bed with no acute distress.  EYES: Pupils equal, round, reactive to light and accommodation. No scleral icterus. Extraocular muscles intact.  HEENT: Head atraumatic, normocephalic. Oropharynx and nasopharynx clear.  NECK:  Supple, no jugular venous distention. No thyroid enlargement, no tenderness.  LUNGS:  Normal breath sounds bilaterally, no wheezing, rales,rhonchi or crepitation. No use of accessory muscles of respiration.  CARDIOVASCULAR: S1, S2 normal. No murmurs, rubs, or gallops.  ABDOMEN: Soft, non-tender, non-distended. Bowel sounds present. No organomegaly or mass.  EXTREMITIES: No pedal edema, cyanosis, or clubbing.  NEUROLOGIC: Cranial nerves II through XII are intact. Muscle strength 5/5 in all extremities. Sensation intact. Gait not checked.  PSYCHIATRIC: The patient is alert and oriented x 3.  SKIN: No obvious rash, lesion, or ulcer.  DATA REVIEW:   CBC Recent Labs  Lab 11/28/17 0341  WBC 12.2*  HGB 13.3  HCT 39.4  PLT 333    Chemistries  Recent Labs  Lab 11/27/17 1145 11/28/17 0341 11/29/17 0424  NA 139 140 142  K 3.4* 3.1* 3.5  CL 96* 105 101  CO2 22 24 29   GLUCOSE 204* 152* 144*  BUN 37* 29* 29*  CREATININE 0.97 0.60 0.49  CALCIUM 10.1 8.5* 8.7*  MG  --  2.0  --   AST 16  --   --   ALT 11  --   --   ALKPHOS 73  --   --   BILITOT 1.9*  --  1.1     Microbiology Results  Results for  orders placed or performed during the hospital encounter of 11/09/14  Wet prep, genital     Status: Abnormal   Collection Time: 11/09/14  6:31 PM  Result Value Ref Range Status   Yeast Wet Prep HPF POC NONE SEEN NONE SEEN Final   Trich, Wet Prep NONE SEEN NONE SEEN Final   Clue Cells Wet Prep HPF POC NONE SEEN NONE SEEN Final   WBC, Wet Prep HPF POC FEW (A) NONE SEEN Final  Chlamydia/NGC rt PCR ([facility] only)     Status: None   Collection Time: 11/09/14  6:31 PM  Result Value Ref Range Status   Specimen source GC/Chlam VAGINA  Final   Chlamydia Tr NOT DETECTED NOT DETECTED Final   N gonorrhoeae NOT DETECTED NOT DETECTED Final    Comment: (NOTE) 100  This methodology has not been evaluated in pregnant women or in 200  patients with a history of hysterectomy. 300 400  This methodology will not be performed on patients less than 18  years of age.     RADIOLOGY:  No  results found.   Management plans discussed with the patient, family and they are in agreement.  CODE STATUS: Full Code   TOTAL TIME TAKING CARE OF THIS PATIENT: 33 minutes.    Demetrios Loll M.D on 11/29/2017 at 2:49 PM  Between 7am to 6pm - Pager - [phone]  After 6pm go to [email or web address] - Proofreader  [facility]  Hospitalists  Office  [phone]  CC: Primary care physician; Remi Haggard, FNP   Note: This dictation was prepared with Dragon dictation along with smaller phrase technology. Any transcriptional errors that result from this process are unintentional.

## 2017-11-29 NOTE — Consult Note (Addendum)
Lucilla Lame, MD [facility]  [address], [place], [place] [address] Phone: [phone] Fax : [phone]  Consultation  Referring Provider:     Dr. Bridgett Larsson Primary Care Physician:  Remi Haggard, FNP Primary Gastroenterologist: Althia Forts         Reason for Consultation:     Nausea  Date of Admission:  11/27/2017 Date of Consultation:  11/29/2017         HPI:   Deanna Haynes is a 52 y.o. female who has a extensive history of alcohol abuse and was admitted with chronic nausea and heartburn.  The patient reports that she has not had any further vomiting but is nauseous at the present time and states that it is because she has not eaten anything.  Also reported that she was weak when she was admitted.  The patient also has a history of anxiety depression in addition to her alcohol abuse.  She states that her symptoms of poor p.o. intake have been present for the last few days and she reports that her last drink was last Tuesday.  The patient reported that she was drinking a large bottle of wine every couple of days.  The patient also had reported that her main concern was heartburn and that she did not think she can tolerate a diet.  That was reported yesterday to the hospitalist but today she tells me that all she wants to do is eat and she does not want to undergo any procedures.  Past Medical History:  Diagnosis Date  . Anxiety   . Cervical cancer [facility])    Radical Hysterectomy 11/26/14 with adjuvant chemoradiation  . Vaginal cuff dehiscence     Past Surgical History:  Procedure Laterality Date  . RADICAL HYSTERECTOMY  11/26/2014   Stage IB2 Cervical Cancer, adjuvant chemoradiation  . REPAIR VAGINAL CUFF      Prior to Admission medications   Medication Sig Start Date End Date Taking? Authorizing Provider  clonazePAM (KLONOPIN) 0.5 MG tablet Take 0.5 mg by mouth daily.   Yes [provider]  ferrous sulfate 325 (65 FE) MG tablet Take 325 mg by mouth daily with  breakfast.   Yes [provider]  Multiple Vitamins-Minerals (MULTIVITAMIN WITH MINERALS) tablet Take 1 tablet by mouth daily.   Yes [provider]  promethazine (PHENERGAN) 25 MG tablet Take 25 mg by mouth every 6 (six) hours as needed for nausea/vomiting. 11/26/17  Yes [provider]  rOPINIRole (REQUIP) 0.5 MG tablet Take 0.5 mg by mouth at bedtime. 11/14/17  Yes [provider]  sertraline (ZOLOFT) 100 MG tablet Take 100 mg by mouth at bedtime. 11/14/17  Yes [provider]  traZODone (DESYREL) 50 MG tablet Take 50 mg by mouth at bedtime.   Yes [provider]  vitamin B-12 (CYANOCOBALAMIN) 100 MCG tablet Take 100 mcg by mouth daily.   Yes [provider]  vitamin C (ASCORBIC ACID) 500 MG tablet Take 500 mg by mouth 2 (two) times daily.   Yes [provider]  alum & mag hydroxide-simeth (MAALOX/MYLANTA) 200-200-20 MG/5ML suspension Take 30 mLs by mouth every 4 (four) hours as needed for indigestion or heartburn. 11/28/17   Demetrios Loll, MD  metoprolol tartrate (LOPRESSOR) 25 MG tablet Take 1 tablet (25 mg total) by mouth 2 (two) times daily. 11/29/17   Demetrios Loll, MD  pantoprazole (PROTONIX) 40 MG tablet Take 1 tablet (40 mg total) by mouth 2 (two) times daily before a meal. 11/28/17  Demetrios Loll, MD    Family History  Problem Relation Age of Onset  . Anxiety disorder Mother   . Cervical cancer Mother      Social History   Tobacco Use  . Smoking status: Former Smoker    Packs/day: 1.00    Years: 20.00    Pack years: 20.00    Types: Cigarettes    Last attempt to quit: 11/01/2014    Years since quitting: 3.0  . Smokeless tobacco: Never Used  Substance Use Topics  . Alcohol use: Yes    Comment:  3 bottles a wine a week.   . Drug use: No    Allergies as of 11/27/2017 - Review Complete 11/27/2017  Allergen Reaction Noted  . Codeine Nausea And Vomiting 11/09/2014    Review of Systems:    All systems reviewed and  negative except where noted in HPI.   Physical Exam:  Vital signs in last 24 hours: Temp:  [98.1 F (36.7 C)-98.9 F (37.2 C)] 98.6 F (37 C) (08/30 0840) Pulse Rate:  [92-101] 93 (08/30 0840) Resp:  [17-20] 20 (08/30 0840) BP: (154-167)/(80-104) 154/96 (08/30 0840) SpO2:  [91 %-96 %] 94 % (08/30 0840) Last BM Date: 11/28/17 General:   Pleasant, cooperative in NAD Head:  Normocephalic and atraumatic. Eyes:   No icterus.   Conjunctiva pink. PERRLA. Ears:  Normal auditory acuity. Neck:  Supple; no masses or thyroidomegaly Lungs: Respirations even and unlabored. Lungs clear to auscultation bilaterally.   No wheezes, crackles, or rhonchi.  Heart:  Regular rate and rhythm;  Without murmur, clicks, rubs or gallops Abdomen:  Soft, nondistended, nontender. Normal bowel sounds. No appreciable masses or hepatomegaly.  No rebound or guarding.  Rectal:  Not performed. Msk:  Symmetrical without gross deformities.    Extremities:  Without edema, cyanosis or clubbing. Neurologic:  Alert and oriented x3;  grossly normal neurologically. Skin:  Intact without significant lesions or rashes. Cervical Nodes:  No significant cervical adenopathy. Psych:  Alert and cooperative. Normal affect.  LAB RESULTS: Recent Labs    11/27/17 1145 11/28/17 0341  WBC 15.4* 12.2*  HGB 16.1* 13.3  HCT 46.5 39.4  PLT 502* 333   BMET Recent Labs    11/27/17 1145 11/28/17 0341 11/29/17 0424  NA 139 140 142  K 3.4* 3.1* 3.5  CL 96* 105 101  CO2 22 24 29   GLUCOSE 204* 152* 144*  BUN 37* 29* 29*  CREATININE 0.97 0.60 0.49  CALCIUM 10.1 8.5* 8.7*   LFT Recent Labs    11/27/17 1145 11/29/17 0424  PROT 8.3*  --   ALBUMIN 4.1  --   AST 16  --   ALT 11  --   ALKPHOS 73  --   BILITOT 1.9* 1.1   PT/INR No results for input(s): LABPROT, INR in the last 72 hours.  STUDIES: No results found.    Impression / Plan:   Assessment: Principal Problem:   Alcohol abuse Active Problems:   Alcoholic  ketosis   Generalized anxiety disorder   Deanna Haynes is a 52 y.o. y/o female with a long extensive history of alcohol abuse who came in with nausea and vomiting with weakness.  She states that her nausea vomiting has improved but she has heartburn.  The patient told the hospitalist yesterday that she could not eat because of the heartburn but reports to me that she wants to eat and she thinks that her symptoms are all because she has not been given a diet.  Plan: This patient comes in with a long extensive history of alcohol abuse with nausea and vomiting and inability keep food down for the last 4 to 5 days.  The patient states that her nausea vomiting has resolved but she still has heartburn.  The patient was offered to be brought down today for a upper endoscopy.  The patient states that she does not want to go through an upper endoscopy and wants to be given food and sent home.  The patient will be started on a soft diet and may be discharged from a GI point of view if she tolerates this.  The patient should be started on a daily PPI for her heartburn and stop alcohol abuse.  Thank you for involving me in the care of this patient.      LOS: 0 days   Lucilla Lame, MD  11/29/2017, 2:41 PM    Note: This dictation was prepared with Dragon dictation along with smaller phrase technology. Any transcriptional errors that result from this process are unintentional.

## 2017-11-29 NOTE — Clinical Social Work Note (Signed)
Clinical Social Work Assessment  Patient Details  Name: Deanna Haynes MRN: [id number] Date of Birth: 06/07/1965  Date of referral:  11/29/17               Reason for consult:  [employer], Substance Use/ETOH Abuse                Permission sought to share information with:    Permission granted to share information::     Name::        Agency::     Relationship::     Contact Information:     Housing/Transportation Living arrangements for the past 2 months:  Apartment Source of Information:  Patient Patient Interpreter Needed:  None Criminal Activity/Legal Involvement Pertinent to Current Situation/Hospitalization:  No - Comment as needed Significant Relationships:  Friend Lives with:  Self Do you feel safe going back to the place where you live?  Yes Need for family participation in patient care:  Yes (Comment)  Care giving concerns:  Patient lives in an apartment alone in [place].    Social Worker assessment / plan:  Holiday representative (CSW) received verbal consult from RN case manager to provide substance abuse resources. CSW met with patient alone at bedside. Patient was alert and oriented X4 and was sitting up in the bed. CSW introduced self and explained role of CSW department. Per patient she works full time at [employer], is independent with her ADLs and drives her own car. Patient reported that she has no children and her friend Deanna Haynes is her HPOA. Per patient she has not had a drink in 2 weeks. Patient reported that she has quit drinking. Patient denied drug use. Patient reported that she does have depression and takes medication for it. Per patient she is not having thoughts about hurting herself. CSW provided patient with a list of [facility] outpatient substance abuse resources including [facility] and [employer]. Patient accepted resources and thanked CSW for visit. Please reconsult if future social work needs arise. CSW signing off.   Employment status:   [employer] information:  Managed Care PT Recommendations:  Not assessed at this time Information / Referral to community resources:  Outpatient Substance Abuse Treatment Options  Patient/Family's Response to care:  Patient accepted resources.   Patient/Family's Understanding of and Emotional Response to Diagnosis, Current Treatment, and Prognosis:  Patient was very pleasant and thanked CSW for visit.   Emotional Assessment Appearance:  Appears stated age Attitude/Demeanor/Rapport:    Affect (typically observed):  Accepting, Adaptable, Pleasant Orientation:  Oriented to Self, Oriented to Place, Oriented to  Time, Oriented to Situation Alcohol / Substance use:  Alcohol Use Psych involvement (Current and /or in the community):  Yes (Comment)(Psych cleared patient )  Discharge Needs  Concerns to be addressed:  No discharge needs identified Readmission within the last 30 days:  No Current discharge risk:  Substance Abuse Barriers to Discharge:  Continued Medical Work up   [employer], [employer], LCSW 11/29/2017, 3:21 PM

## 2017-11-29 NOTE — Discharge Instructions (Signed)
The patient should be started on a daily PPI for her heartburn and stop alcohol abuse.

## 2017-11-29 NOTE — Care Management (Signed)
Met with patient at bedside. She is pleasant. Discharging today. Patient states she will have a friend picking her up later today. She verbalized no concerns. Offered support. Instructed patient to have the nurse call RNCM if she had any concerns or questions prior to discharge.

## 2017-11-30 ENCOUNTER — Encounter: Payer: Self-pay | Admitting: Emergency Medicine

## 2017-11-30 ENCOUNTER — Emergency Department: Payer: Managed Care, Other (non HMO)

## 2017-11-30 ENCOUNTER — Inpatient Hospital Stay
Admission: EM | Admit: 2017-11-30 | Discharge: 2017-12-31 | DRG: 871 | Disposition: E | Payer: Managed Care, Other (non HMO) | Attending: Internal Medicine | Admitting: Internal Medicine

## 2017-11-30 ENCOUNTER — Other Ambulatory Visit: Payer: Self-pay

## 2017-11-30 DIAGNOSIS — Z9221 Personal history of antineoplastic chemotherapy: Secondary | ICD-10-CM

## 2017-11-30 DIAGNOSIS — F10239 Alcohol dependence with withdrawal, unspecified: Secondary | ICD-10-CM | POA: Diagnosis present

## 2017-11-30 DIAGNOSIS — E876 Hypokalemia: Secondary | ICD-10-CM | POA: Diagnosis present

## 2017-11-30 DIAGNOSIS — Z818 Family history of other mental and behavioral disorders: Secondary | ICD-10-CM

## 2017-11-30 DIAGNOSIS — A419 Sepsis, unspecified organism: Principal | ICD-10-CM | POA: Diagnosis present

## 2017-11-30 DIAGNOSIS — R739 Hyperglycemia, unspecified: Secondary | ICD-10-CM | POA: Diagnosis present

## 2017-11-30 DIAGNOSIS — K5652 Intestinal adhesions [bands] with complete obstruction: Secondary | ICD-10-CM

## 2017-11-30 DIAGNOSIS — K219 Gastro-esophageal reflux disease without esophagitis: Secondary | ICD-10-CM | POA: Diagnosis present

## 2017-11-30 DIAGNOSIS — R34 Anuria and oliguria: Secondary | ICD-10-CM | POA: Diagnosis not present

## 2017-11-30 DIAGNOSIS — Z4659 Encounter for fitting and adjustment of other gastrointestinal appliance and device: Secondary | ICD-10-CM

## 2017-11-30 DIAGNOSIS — F411 Generalized anxiety disorder: Secondary | ICD-10-CM | POA: Diagnosis present

## 2017-11-30 DIAGNOSIS — J9601 Acute respiratory failure with hypoxia: Secondary | ICD-10-CM | POA: Diagnosis present

## 2017-11-30 DIAGNOSIS — Z6829 Body mass index (BMI) 29.0-29.9, adult: Secondary | ICD-10-CM

## 2017-11-30 DIAGNOSIS — Z79899 Other long term (current) drug therapy: Secondary | ICD-10-CM

## 2017-11-30 DIAGNOSIS — F32A Depression, unspecified: Secondary | ICD-10-CM

## 2017-11-30 DIAGNOSIS — E860 Dehydration: Secondary | ICD-10-CM | POA: Diagnosis present

## 2017-11-30 DIAGNOSIS — N170 Acute kidney failure with tubular necrosis: Secondary | ICD-10-CM | POA: Diagnosis not present

## 2017-11-30 DIAGNOSIS — Z01818 Encounter for other preprocedural examination: Secondary | ICD-10-CM

## 2017-11-30 DIAGNOSIS — D638 Anemia in other chronic diseases classified elsewhere: Secondary | ICD-10-CM | POA: Diagnosis present

## 2017-11-30 DIAGNOSIS — G92 Toxic encephalopathy: Secondary | ICD-10-CM | POA: Diagnosis not present

## 2017-11-30 DIAGNOSIS — R6521 Severe sepsis with septic shock: Secondary | ICD-10-CM | POA: Diagnosis present

## 2017-11-30 DIAGNOSIS — Z23 Encounter for immunization: Secondary | ICD-10-CM

## 2017-11-30 DIAGNOSIS — N390 Urinary tract infection, site not specified: Secondary | ICD-10-CM | POA: Diagnosis present

## 2017-11-30 DIAGNOSIS — J9600 Acute respiratory failure, unspecified whether with hypoxia or hypercapnia: Secondary | ICD-10-CM

## 2017-11-30 DIAGNOSIS — J80 Acute respiratory distress syndrome: Secondary | ICD-10-CM | POA: Diagnosis not present

## 2017-11-30 DIAGNOSIS — J189 Pneumonia, unspecified organism: Secondary | ICD-10-CM

## 2017-11-30 DIAGNOSIS — G2581 Restless legs syndrome: Secondary | ICD-10-CM | POA: Diagnosis present

## 2017-11-30 DIAGNOSIS — F329 Major depressive disorder, single episode, unspecified: Secondary | ICD-10-CM | POA: Diagnosis present

## 2017-11-30 DIAGNOSIS — Z87891 Personal history of nicotine dependence: Secondary | ICD-10-CM

## 2017-11-30 DIAGNOSIS — R0602 Shortness of breath: Secondary | ICD-10-CM

## 2017-11-30 DIAGNOSIS — Z90710 Acquired absence of both cervix and uterus: Secondary | ICD-10-CM

## 2017-11-30 DIAGNOSIS — Z885 Allergy status to narcotic agent status: Secondary | ICD-10-CM

## 2017-11-30 DIAGNOSIS — Z7141 Alcohol abuse counseling and surveillance of alcoholic: Secondary | ICD-10-CM

## 2017-11-30 DIAGNOSIS — E872 Acidosis: Secondary | ICD-10-CM | POA: Diagnosis present

## 2017-11-30 DIAGNOSIS — Z8541 Personal history of malignant neoplasm of cervix uteri: Secondary | ICD-10-CM

## 2017-11-30 DIAGNOSIS — J690 Pneumonitis due to inhalation of food and vomit: Secondary | ICD-10-CM | POA: Diagnosis present

## 2017-11-30 DIAGNOSIS — Z66 Do not resuscitate: Secondary | ICD-10-CM | POA: Diagnosis present

## 2017-11-30 DIAGNOSIS — I959 Hypotension, unspecified: Secondary | ICD-10-CM | POA: Diagnosis present

## 2017-11-30 DIAGNOSIS — R627 Adult failure to thrive: Secondary | ICD-10-CM

## 2017-11-30 DIAGNOSIS — Z515 Encounter for palliative care: Secondary | ICD-10-CM | POA: Diagnosis not present

## 2017-11-30 DIAGNOSIS — Z923 Personal history of irradiation: Secondary | ICD-10-CM

## 2017-11-30 LAB — CBC WITH DIFFERENTIAL/PLATELET
Basophils Absolute: 0 10*3/uL (ref 0–0.1)
Basophils Relative: 0 %
Eosinophils Absolute: 0 10*3/uL (ref 0–0.7)
Eosinophils Relative: 0 %
HEMATOCRIT: 43.1 % (ref 35.0–47.0)
Hemoglobin: 14.9 g/dL (ref 12.0–16.0)
LYMPHS ABS: 0.9 10*3/uL — AB (ref 1.0–3.6)
Lymphocytes Relative: 6 %
MCH: 29.2 pg (ref 26.0–34.0)
MCHC: 34.6 g/dL (ref 32.0–36.0)
MCV: 84.6 fL (ref 80.0–100.0)
MONOS PCT: 12 %
Monocytes Absolute: 1.7 10*3/uL — ABNORMAL HIGH (ref 0.2–0.9)
NEUTROS ABS: 11.9 10*3/uL — AB (ref 1.4–6.5)
Neutrophils Relative %: 82 %
Platelets: 531 10*3/uL — ABNORMAL HIGH (ref 150–440)
RBC: 5.09 MIL/uL (ref 3.80–5.20)
RDW: 13.2 % (ref 11.5–14.5)
WBC: 14.6 10*3/uL — ABNORMAL HIGH (ref 3.6–11.0)

## 2017-11-30 LAB — COMPREHENSIVE METABOLIC PANEL
ALT: 13 U/L (ref 0–44)
AST: 15 U/L (ref 15–41)
Albumin: 3.3 g/dL — ABNORMAL LOW (ref 3.5–5.0)
Alkaline Phosphatase: 50 U/L (ref 38–126)
Anion gap: 14 (ref 5–15)
BILIRUBIN TOTAL: 1.3 mg/dL — AB (ref 0.3–1.2)
BUN: 33 mg/dL — ABNORMAL HIGH (ref 6–20)
CHLORIDE: 100 mmol/L (ref 98–111)
CO2: 23 mmol/L (ref 22–32)
Calcium: 9 mg/dL (ref 8.9–10.3)
Creatinine, Ser: 1.13 mg/dL — ABNORMAL HIGH (ref 0.44–1.00)
GFR, EST NON AFRICAN AMERICAN: 54 mL/min — AB (ref >60)
GLUCOSE: 148 mg/dL — AB (ref 70–99)
Potassium: 3.8 mmol/L (ref 3.5–5.1)
Sodium: 137 mmol/L (ref 135–145)
Total Protein: 6.5 g/dL (ref 6.5–8.1)

## 2017-11-30 LAB — ETHANOL: Alcohol, Ethyl (B): <10 mg/dL (ref <10)

## 2017-11-30 LAB — TROPONIN I

## 2017-11-30 MED ORDER — LORAZEPAM 1 MG PO TABS
1.0000 mg | ORAL_TABLET | Freq: Four times a day (QID) | ORAL | Status: DC | PRN
  Filled 2017-11-30: qty 1

## 2017-11-30 MED ORDER — SODIUM CHLORIDE 0.9 % IV BOLUS
1000.0000 mL | Freq: Once | INTRAVENOUS | Status: AC
  Administered 2017-11-30: 1000 mL via INTRAVENOUS

## 2017-11-30 MED ORDER — ACETAMINOPHEN 500 MG PO TABS
1000.0000 mg | ORAL_TABLET | Freq: Once | ORAL | Status: AC
  Administered 2017-11-30: 1000 mg via ORAL
  Filled 2017-11-30: qty 2

## 2017-11-30 MED ORDER — ONDANSETRON HCL 4 MG/2ML IJ SOLN
4.0000 mg | Freq: Once | INTRAMUSCULAR | Status: AC
  Administered 2017-11-30: 4 mg via INTRAVENOUS
  Filled 2017-11-30: qty 2

## 2017-11-30 MED ORDER — THIAMINE HCL 100 MG/ML IJ SOLN
Freq: Once | INTRAVENOUS | Status: AC
  Administered 2017-11-30: 22:00:00 via INTRAVENOUS
  Filled 2017-11-30: qty 1000

## 2017-11-30 MED ORDER — LORAZEPAM 2 MG/ML IJ SOLN: 0.0000 mg | Freq: Two times a day (BID) | INTRAMUSCULAR | Status: DC

## 2017-11-30 MED ORDER — ADULT MULTIVITAMIN W/MINERALS CH
1.0000 | ORAL_TABLET | Freq: Every day | ORAL | Status: DC
  Administered 2017-12-01: 10:00:00 1 via ORAL
  Filled 2017-11-30: qty 1

## 2017-11-30 MED ORDER — LORAZEPAM 2 MG/ML IJ SOLN
0.0000 mg | Freq: Four times a day (QID) | INTRAMUSCULAR | Status: DC
  Administered 2017-12-01: 01:00:00 2 mg via INTRAVENOUS
  Administered 2017-12-01: 1 mg via INTRAVENOUS
  Filled 2017-11-30 (×2): qty 1

## 2017-11-30 MED ORDER — VITAMIN B-1 100 MG PO TABS
100.0000 mg | ORAL_TABLET | Freq: Every day | ORAL | Status: DC
  Administered 2017-12-01: 100 mg via ORAL
  Filled 2017-11-30 (×2): qty 1

## 2017-11-30 MED ORDER — THIAMINE HCL 100 MG/ML IJ SOLN
100.0000 mg | Freq: Every day | INTRAMUSCULAR | Status: DC
  Administered 2017-12-02: 100 mg via INTRAVENOUS
  Filled 2017-11-30: qty 2
  Filled 2017-11-30: qty 1

## 2017-11-30 MED ORDER — FOLIC ACID 1 MG PO TABS
1.0000 mg | ORAL_TABLET | Freq: Every day | ORAL | Status: DC
  Administered 2017-12-01: 1 mg via ORAL
  Filled 2017-11-30: qty 1

## 2017-11-30 MED ORDER — LORAZEPAM 2 MG/ML IJ SOLN
1.0000 mg | Freq: Four times a day (QID) | INTRAMUSCULAR | Status: DC | PRN
  Administered 2017-12-01: 12:00:00 1 mg via INTRAVENOUS
  Filled 2017-11-30: qty 1

## 2017-11-30 NOTE — ED Triage Notes (Signed)
Pt to ED via EMS from home c/o weakness and hypotension.  EMS states was recently discharged for alcoholic ketoacidosis and has been weak since leaving.  States unable to eat or drink.  Last alcoholic drink 2 weeks ago.  Pt presents pale, diaphoretic, and tachypnic.  EMS vitals 82/56 BP, 147 CBG, 120 HR, 90% RA and placed on 3L [place], 40 RR. Hx of depression and alcohol abuse.

## 2017-11-30 NOTE — ED Provider Notes (Signed)
[facility] Emergency Department Provider Note ____________________________________________   First MD Initiated Contact with Patient 11/05/2017 2040     (approximate)  I have reviewed the triage vital signs and the nursing notes.   HISTORY  Chief Complaint Hypotension and Weakness  HPI Deanna Haynes is a 52 y.o. female with a history of alcohol is and was discharged 1 day ago from the hospital for an admission for alcoholic ketoacidosis who was presented to the emergency department today with generalized weakness.  Found to be hypotensive to the 80s and tachycardic for EMS.  Patient also says that she has been nauseous.  EMS found the patient to be borderline hypoxic at approximately 90% on room air and was placed on 2 L of nasal cannula oxygen.  Patient without any burning with urination reports of diarrhea.  Says that she does not have anyone to care for at home and says that she did not want to be discharged in the hospital because of persistent weakness.  Reports that she has not had a drink in 2 weeks.   Past Medical History:  Diagnosis Date  . Anxiety   . Cervical cancer [facility])    Radical Hysterectomy 11/26/14 with adjuvant chemoradiation  . Vaginal cuff dehiscence     Patient Active Problem List   Diagnosis Date Noted  . Intractable vomiting with nausea   . Gastroesophageal reflux disease   . Alcoholic ketosis 11/57/2620  . Alcohol abuse 11/27/2017  . Generalized anxiety disorder 11/27/2017  . Dehiscence of incision 02/01/2015  . Anxiety 12/03/2014  . Lump in thyroid 11/25/2014  . Malignant neoplasm of cervix ([place]) 11/24/2014  . Cervical cancer ([place]) 11/17/2014    Past Surgical History:  Procedure Laterality Date  . ABDOMINAL HYSTERECTOMY    . RADICAL HYSTERECTOMY  11/26/2014   Stage IB2 Cervical Cancer, adjuvant chemoradiation  . REPAIR VAGINAL CUFF      Prior to Admission medications   Medication Sig Start Date End Date Taking?  Authorizing Provider  alum & mag hydroxide-simeth (MAALOX/MYLANTA) 200-200-20 MG/5ML suspension Take 30 mLs by mouth every 4 (four) hours as needed for indigestion or heartburn. 11/28/17   Demetrios Loll, MD  clonazePAM (KLONOPIN) 0.5 MG tablet Take 0.5 mg by mouth daily.    [provider]  ferrous sulfate 325 (65 FE) MG tablet Take 325 mg by mouth daily with breakfast.    [provider]  metoprolol tartrate (LOPRESSOR) 25 MG tablet Take 1 tablet (25 mg total) by mouth 2 (two) times daily. 11/29/17   Demetrios Loll, MD  Multiple Vitamins-Minerals (MULTIVITAMIN WITH MINERALS) tablet Take 1 tablet by mouth daily.    [provider]  pantoprazole (PROTONIX) 40 MG tablet Take 1 tablet (40 mg total) by mouth 2 (two) times daily before a meal. 11/28/17   Demetrios Loll, MD  promethazine (PHENERGAN) 25 MG tablet Take 25 mg by mouth every 6 (six) hours as needed for nausea/vomiting. 11/26/17   [provider]  rOPINIRole (REQUIP) 0.5 MG tablet Take 0.5 mg by mouth at bedtime. 11/14/17   [provider]  sertraline (ZOLOFT) 100 MG tablet Take 100 mg by mouth at bedtime. 11/14/17   [provider]  traZODone (DESYREL) 50 MG tablet Take 50 mg by mouth at bedtime.    [provider]  vitamin B-12 (CYANOCOBALAMIN) 100 MCG tablet Take 100 mcg by mouth daily.    [provider]    Allergies Codeine  Family History  Problem Relation Age of Onset  .  Anxiety disorder Mother   . Cervical cancer Mother     Social History Social History   Tobacco Use  . Smoking status: Former Smoker    Packs/day: 1.00    Years: 20.00    Pack years: 20.00    Types: Cigarettes    Last attempt to quit: 11/01/2014    Years since quitting: 3.0  . Smokeless tobacco: Never Used  Substance Use Topics  . Alcohol use: Yes    Comment:  3 bottles a wine a week. Last drank approx. 11/16/17  . Drug use: No    Review of Systems  Constitutional: No fever/chills Eyes: No  visual changes. ENT: No sore throat. Cardiovascular: Denies chest pain. Respiratory: Denies shortness of breath. Gastrointestinal: No abdominal pain.   no vomiting.  No diarrhea.  No constipation. Genitourinary: Negative for dysuria. Musculoskeletal: Negative for back pain. Skin: Negative for rash. Neurological: Negative for headaches, focal weakness or numbness.  ____________________________________________   PHYSICAL EXAM:  VITAL SIGNS: ED Triage Vitals  Enc Vitals Group     BP 11/06/2017 2042 108/69     Pulse Rate 11/05/2017 2042 (!) 118     Resp 11/14/2017 2042 (!) 36     Temp 11/05/2017 2042 97.8 F (36.6 C)     Temp Source 11/15/2017 2042 Oral     SpO2 11/21/2017 2042 90 %     Weight 11/16/2017 2043 160 lb (72.6 kg)     Height 11/09/2017 2043 5\' 4"  (1.626 m)     Head Circumference --      Peak Flow --      Pain Score 11/16/2017 2043 0     Pain Loc --      Pain Edu? --      Excl. in [place]? --     Constitutional: Alert but appears uncomfortable.  Slightly diaphoretic. Eyes: Conjunctivae are normal.  Head: Atraumatic. Nose: No congestion/rhinnorhea. Mouth/Throat: Mucous membranes are moist.  Neck: No stridor.   Cardiovascular: Tachycardic, regular rhythm. Grossly normal heart sounds. Respiratory: Normal respiratory effort.  No retractions. Lungs CTAB. Gastrointestinal: Soft and nontender. No distention.  Musculoskeletal: No lower extremity tenderness nor edema.  No joint effusions. Neurologic:  Normal speech and language. No gross focal neurologic deficits are appreciated. Skin:  Skin is warm, dry and intact. No rash noted. Psychiatric: Mood and affect are normal. Speech and behavior are normal.  ____________________________________________   LABS (all labs ordered are listed, but only abnormal results are displayed)  Labs Reviewed  CBC WITH DIFFERENTIAL/PLATELET - Abnormal; Notable for the following components:      Result Value   WBC 14.6 (*)    Platelets 531 (*)    Neutro  Abs 11.9 (*)    Lymphs Abs 0.9 (*)    Monocytes Absolute 1.7 (*)    All other components within normal limits  COMPREHENSIVE METABOLIC PANEL - Abnormal; Notable for the following components:   Glucose, Bld 148 (*)    BUN 33 (*)    Creatinine, Ser 1.13 (*)    Albumin 3.3 (*)    Total Bilirubin 1.3 (*)    GFR calc non Af Amer 54 (*)    All other components within normal limits  ETHANOL  TROPONIN I  URINALYSIS, COMPLETE (UACMP) WITH MICROSCOPIC  URINE DRUG SCREEN, QUALITATIVE ([facility] ONLY)   ____________________________________________  EKG  ED ECG REPORT I, Doran Stabler, the attending physician, personally viewed and interpreted this ECG.   Date: 11/12/2017  EKG Time: 2042  Rate: 116  Rhythm: sinus tachycardia  Axis: Normal  Intervals:none  ST&T Change: No ST segment elevation or depression.  No abnormal T wave inversion.  However, exam is confounded by wandering baseline.  ____________________________________________  RADIOLOGY  Chest x-ray without acute process. ____________________________________________   PROCEDURES  Procedure(s) performed:   Procedures  Critical Care performed:   ____________________________________________   INITIAL IMPRESSION / ASSESSMENT AND PLAN / ED COURSE  Pertinent labs & imaging results that were available during my care of the patient were reviewed by me and considered in my medical decision making (see chart for details).  DDX: Generalized weakness, hyperglycemia, dehydration, pneumonia, UTI, alcoholic ketoacidosis, Wernicke's encephalopathy As part of my medical decision making, I reviewed the following data within the [place] chart reviewed and Notes from prior ED visits  ----------------------------------------- 9:47 PM on 11/25/2017 -----------------------------------------  Patient will be readmitted to the hospital for failure to thrive.  We will also consult psychiatry for depression.   Signed out to Dr. Duane Boston.  No suicidal or homicidal ideation. ____________________________________________   FINAL CLINICAL IMPRESSION(S) / ED DIAGNOSES  Failure to thrive.  NEW MEDICATIONS STARTED DURING THIS VISIT:  New Prescriptions   No medications on file     Note:  This document was prepared using Dragon voice recognition software and may include unintentional dictation errors.     Orbie Pyo, MD 11/19/2017 2147

## 2017-11-30 NOTE — H&P (Signed)
[facility] NAME: Deanna Haynes    MR#:  [id number]  DATE OF BIRTH:  [birth year]  DATE OF ADMISSION:  11/24/2017  PRIMARY CARE PHYSICIAN: Remi Haggard, FNP   REQUESTING/REFERRING PHYSICIAN:   CHIEF COMPLAINT:   Chief Complaint  Patient presents with  . Hypotension  . Weakness    HISTORY OF PRESENT ILLNESS: Deanna Haynes  is a 52 y.o. female with a known history of alcohol abuse and dependence.  Patient was recently hospitalized for alcoholic ketoacidosis and discharged 2 days ago. She presents to emergency room this time for shortness of breath, cough, nausea and severe generalized weakness.  She lives by herself and she feels that she cannot take care of herself, alone at home.  She is currently drowsy, unable to provide reliable history.  Most information was taken from reviewing the medical chart and from discussion with emergency room physician. At the arrival to emergency room systolic blood pressure was low and 80s, heart rate was elevated, in 120s oxygen saturation was low at 90% on room air. Blood test done emergency room are notable for creatinine level of 1.13, BUN 33, WBC 14.6.  Alcohol level is low, normal.  Urine drug screen is negative. UA is positive for UTI and chest x-ray shows heterogeneous opacities right mid lower lung and left lung base. Is admitted for further evaluation and treatment.    PAST MEDICAL HISTORY:   Past Medical History:  Diagnosis Date  . Anxiety   . Cervical cancer [facility])    Radical Hysterectomy 11/26/14 with adjuvant chemoradiation  . Vaginal cuff dehiscence     PAST SURGICAL HISTORY:  Past Surgical History:  Procedure Laterality Date  . ABDOMINAL HYSTERECTOMY    . RADICAL HYSTERECTOMY  11/26/2014   Stage IB2 Cervical Cancer, adjuvant chemoradiation  . REPAIR VAGINAL CUFF      SOCIAL HISTORY:  Social History   Tobacco Use  . Smoking status: Former Smoker     Packs/day: 1.00    Years: 20.00    Pack years: 20.00    Types: Cigarettes    Last attempt to quit: 11/01/2014    Years since quitting: 3.0  . Smokeless tobacco: Never Used  Substance Use Topics  . Alcohol use: Yes    Comment:  3 bottles a wine a week. Last drank approx. 11/16/17    FAMILY HISTORY:  Family History  Problem Relation Age of Onset  . Anxiety disorder Mother   . Cervical cancer Mother     DRUG ALLERGIES:  Allergies  Allergen Reactions  . Codeine Nausea And Vomiting    REVIEW OF SYSTEMS:   Unable to obtain reliable review of systems, due to patient being very drowsy.  MEDICATIONS AT HOME:  Prior to Admission medications   Medication Sig Start Date End Date Taking? Authorizing Provider  alum & mag hydroxide-simeth (MAALOX/MYLANTA) 200-200-20 MG/5ML suspension Take 30 mLs by mouth every 4 (four) hours as needed for indigestion or heartburn. 11/28/17  Yes Demetrios Loll, MD  clonazePAM (KLONOPIN) 0.5 MG tablet Take 0.5 mg by mouth daily.   Yes [provider]  ferrous sulfate 325 (65 FE) MG tablet Take 325 mg by mouth daily with breakfast.   Yes [provider]  metoprolol tartrate (LOPRESSOR) 25 MG tablet Take 1 tablet (25 mg total) by mouth 2 (two) times daily. 11/29/17  Yes Demetrios Loll, MD  Multiple Vitamins-Minerals (MULTIVITAMIN WITH MINERALS) tablet Take 1 tablet by mouth daily.  Yes [provider]  pantoprazole (PROTONIX) 40 MG tablet Take 1 tablet (40 mg total) by mouth 2 (two) times daily before a meal. 11/28/17  Yes Demetrios Loll, MD  promethazine (PHENERGAN) 25 MG tablet Take 25 mg by mouth every 6 (six) hours as needed for nausea/vomiting. 11/26/17  Yes [provider]  rOPINIRole (REQUIP) 0.5 MG tablet Take 0.5 mg by mouth at bedtime. 11/14/17  Yes [provider]  sertraline (ZOLOFT) 100 MG tablet Take 100 mg by mouth at bedtime. 11/14/17  Yes [provider]  traZODone (DESYREL) 50 MG tablet Take 50 mg by mouth at  bedtime.   Yes [provider]  vitamin B-12 (CYANOCOBALAMIN) 100 MCG tablet Take 100 mcg by mouth daily.   Yes [provider]      PHYSICAL EXAMINATION:   VITAL SIGNS: Blood pressure 118/75, pulse (!) 111, temperature 97.8 F (36.6 C), temperature source Oral, resp. rate (!) 36, height 5\' 4"  (1.626 m), weight 72.6 kg, SpO2 94 %.  GENERAL:  52 y.o.-year-old patient lying in the bed with mild respiratory distress.  She looks acutely ill but nontoxic. EYES: Pupils equal, round, reactive to light and accommodation. No scleral icterus.  HEENT: Head atraumatic, normocephalic. Oropharynx and nasopharynx clear.  NECK:  Supple, no jugular venous distention. No thyroid enlargement, no tenderness.  LUNGS: Normal breath sounds bilaterally, no wheezing, rales,rhonchi or crepitation. No use of accessory muscles of respiration.  CARDIOVASCULAR: S1, S2 normal. No S3/S4.  ABDOMEN: Soft, nontender, nondistended. Bowel sounds present. No organomegaly or mass.  EXTREMITIES: No pedal edema, cyanosis, or clubbing.  NEUROLOGIC: No focal weakness. PSYCHIATRIC: The patient is alert, but drowsy.  SKIN: No obvious rash, lesion, or ulcer.   LABORATORY PANEL:   CBC Recent Labs  Lab 11/27/17 1145 11/28/17 0341 11/24/2017 2048  WBC 15.4* 12.2* 14.6*  HGB 16.1* 13.3 14.9  HCT 46.5 39.4 43.1  PLT 502* 333 531*  MCV 84.1 84.9 84.6  MCH 29.2 28.7 29.2  MCHC 34.7 33.8 34.6  RDW 13.2 13.3 13.2  LYMPHSABS  --   --  0.9*  MONOABS  --   --  1.7*  EOSABS  --   --  0.0  BASOSABS  --   --  0.0   ------------------------------------------------------------------------------------------------------------------  Chemistries  Recent Labs  Lab 11/27/17 1145 11/28/17 0341 11/29/17 0424 11/07/2017 2048  NA 139 140 142 137  K 3.4* 3.1* 3.5 3.8  CL 96* 105 101 100  CO2 22 24 29 23   GLUCOSE 204* 152* 144* 148*  BUN 37* 29* 29* 33*  CREATININE 0.97 0.60 0.49 1.13*  CALCIUM 10.1 8.5* 8.7* 9.0   MG  --  2.0  --   --   AST 16  --   --  15  ALT 11  --   --  13  ALKPHOS 73  --   --  50  BILITOT 1.9*  --  1.1 1.3*   ------------------------------------------------------------------------------------------------------------------ estimated creatinine clearance is 55.6 mL/min (A) (by C-G formula based on SCr of 1.13 mg/dL (H)). ------------------------------------------------------------------------------------------------------------------ No results for input(s): TSH, T4TOTAL, T3FREE, THYROIDAB in the last 72 hours.  Invalid input(s): FREET3   Coagulation profile No results for input(s): INR, PROTIME in the last 168 hours. ------------------------------------------------------------------------------------------------------------------- No results for input(s): DDIMER in the last 72 hours. -------------------------------------------------------------------------------------------------------------------  Cardiac Enzymes Recent Labs  Lab 11/18/2017 2048  TROPONINI <0.03   ------------------------------------------------------------------------------------------------------------------ Invalid input(s): POCBNP  ---------------------------------------------------------------------------------------------------------------  Urinalysis    Component Value Date/Time   COLORURINE YELLOW (  A) 11/27/2017 1145   APPEARANCEUR HAZY (A) 11/27/2017 1145   LABSPEC 1.021 11/27/2017 1145   PHURINE 5.0 11/27/2017 1145   GLUCOSEU NEGATIVE 11/27/2017 1145   HGBUR NEGATIVE 11/27/2017 1145   BILIRUBINUR NEGATIVE 11/27/2017 1145   KETONESUR 20 (A) 11/27/2017 1145   PROTEINUR 30 (A) 11/27/2017 1145   NITRITE NEGATIVE 11/27/2017 1145   LEUKOCYTESUR NEGATIVE 11/27/2017 1145     RADIOLOGY: Dg Chest 1 View  Result Date: 11/18/2017 CLINICAL DATA:  Patient with weakness and hypotension. EXAM: CHEST  1 VIEW COMPARISON:  None. FINDINGS: Monitoring leads overlie the patient. Normal cardiac and  mediastinal contours. Heterogeneous opacities right mid lower lung and left lung base. No pleural effusion or pneumothorax. IMPRESSION: Heterogeneous opacities right mid lower lung and left lung base may represent pneumonia in the appropriate clinical setting. Followup PA and lateral chest X-ray is recommended in 3-4 weeks following trial of antibiotic therapy to ensure resolution and exclude underlying malignancy. Electronically Signed   By: Lovey Newcomer M.D.   On: 11/12/2017 22:07    EKG: Orders placed or performed during the hospital encounter of 11/19/2017  . ED EKG  . ED EKG    IMPRESSION AND PLAN:  1. HCAP, will start broad-spectrum IV antibiotics.  Continue oxygen therapy and nebulizer treatments as needed. 2.  Acute UTI, will treat with broad-spectrum antibiotic while waiting for urine culture results. 3.  ARF, likely prerenal, secondary to poor p.o. Intake.  We will start IV fluids and monitor kidney function closely.  Avoid nephrotoxic medications. 4.  Alcohol withdrawal symptoms.  Will place patient on CIWA alcohol, which includes thiamine replacement. 5.  Hypertension.  Blood pressure is currently on the low side, likely secondary to dehydration, improving with IV fluids.  We will hold BP meds for now and continue IV fluids.  Monitor blood pressure closely.  All the records are reviewed and case discussed with ED provider. Management plans discussed with the patient, who is in agreement.  CODE STATUS: Full Code Status History    Date Active Date Inactive Code Status Order ID Comments User Context   11/27/2017 1508 11/29/2017 1929 Full Code [id number]  Henreitta Leber, MD Inpatient    Advance Directive Documentation     Most Recent Value  Type of Advance Directive  Healthcare Power of Attorney  Pre-existing out of facility DNR order (yellow form or pink MOST form)  -  "MOST" Form in Place?  -       TOTAL TIME TAKING CARE OF THIS PATIENT: 45 minutes.    Amelia Jo M.D on  11/18/2017 at 10:55 PM  Between 7am to 6pm - Pager - [phone]  After 6pm go to [email or web address] - password [email or web address] [facility] [place] [facility]  [phone]  CC: Primary care physician; Remi Haggard, FNP

## 2017-12-01 ENCOUNTER — Inpatient Hospital Stay: Payer: Managed Care, Other (non HMO)

## 2017-12-01 ENCOUNTER — Encounter: Payer: Self-pay | Admitting: Radiology

## 2017-12-01 DIAGNOSIS — F331 Major depressive disorder, recurrent, moderate: Secondary | ICD-10-CM

## 2017-12-01 DIAGNOSIS — E876 Hypokalemia: Secondary | ICD-10-CM | POA: Diagnosis present

## 2017-12-01 DIAGNOSIS — G2581 Restless legs syndrome: Secondary | ICD-10-CM | POA: Diagnosis present

## 2017-12-01 DIAGNOSIS — Z23 Encounter for immunization: Secondary | ICD-10-CM | POA: Diagnosis not present

## 2017-12-01 DIAGNOSIS — J9601 Acute respiratory failure with hypoxia: Secondary | ICD-10-CM | POA: Diagnosis not present

## 2017-12-01 DIAGNOSIS — J690 Pneumonitis due to inhalation of food and vomit: Secondary | ICD-10-CM | POA: Diagnosis present

## 2017-12-01 DIAGNOSIS — F10239 Alcohol dependence with withdrawal, unspecified: Secondary | ICD-10-CM | POA: Diagnosis present

## 2017-12-01 DIAGNOSIS — N170 Acute kidney failure with tubular necrosis: Secondary | ICD-10-CM | POA: Diagnosis not present

## 2017-12-01 DIAGNOSIS — R0602 Shortness of breath: Secondary | ICD-10-CM | POA: Diagnosis not present

## 2017-12-01 DIAGNOSIS — K5652 Intestinal adhesions [bands] with complete obstruction: Secondary | ICD-10-CM | POA: Diagnosis present

## 2017-12-01 DIAGNOSIS — R627 Adult failure to thrive: Secondary | ICD-10-CM | POA: Diagnosis present

## 2017-12-01 DIAGNOSIS — F1020 Alcohol dependence, uncomplicated: Secondary | ICD-10-CM

## 2017-12-01 DIAGNOSIS — F411 Generalized anxiety disorder: Secondary | ICD-10-CM

## 2017-12-01 DIAGNOSIS — R6521 Severe sepsis with septic shock: Secondary | ICD-10-CM | POA: Diagnosis present

## 2017-12-01 DIAGNOSIS — R739 Hyperglycemia, unspecified: Secondary | ICD-10-CM | POA: Diagnosis present

## 2017-12-01 DIAGNOSIS — K219 Gastro-esophageal reflux disease without esophagitis: Secondary | ICD-10-CM | POA: Diagnosis present

## 2017-12-01 DIAGNOSIS — E872 Acidosis: Secondary | ICD-10-CM | POA: Diagnosis present

## 2017-12-01 DIAGNOSIS — F329 Major depressive disorder, single episode, unspecified: Secondary | ICD-10-CM | POA: Diagnosis present

## 2017-12-01 DIAGNOSIS — R34 Anuria and oliguria: Secondary | ICD-10-CM | POA: Diagnosis not present

## 2017-12-01 DIAGNOSIS — D638 Anemia in other chronic diseases classified elsewhere: Secondary | ICD-10-CM | POA: Diagnosis present

## 2017-12-01 DIAGNOSIS — Z515 Encounter for palliative care: Secondary | ICD-10-CM | POA: Diagnosis not present

## 2017-12-01 DIAGNOSIS — G92 Toxic encephalopathy: Secondary | ICD-10-CM | POA: Diagnosis not present

## 2017-12-01 DIAGNOSIS — J80 Acute respiratory distress syndrome: Secondary | ICD-10-CM | POA: Diagnosis not present

## 2017-12-01 DIAGNOSIS — N390 Urinary tract infection, site not specified: Secondary | ICD-10-CM | POA: Diagnosis present

## 2017-12-01 DIAGNOSIS — Z66 Do not resuscitate: Secondary | ICD-10-CM | POA: Diagnosis present

## 2017-12-01 DIAGNOSIS — E860 Dehydration: Secondary | ICD-10-CM | POA: Diagnosis present

## 2017-12-01 DIAGNOSIS — A419 Sepsis, unspecified organism: Secondary | ICD-10-CM | POA: Diagnosis present

## 2017-12-01 LAB — BASIC METABOLIC PANEL
Anion gap: 12 (ref 5–15)
BUN: 37 mg/dL — ABNORMAL HIGH (ref 6–20)
CALCIUM: 8.4 mg/dL — AB (ref 8.9–10.3)
CO2: 25 mmol/L (ref 22–32)
CREATININE: 1.07 mg/dL — AB (ref 0.44–1.00)
Chloride: 102 mmol/L (ref 98–111)
GFR calc Af Amer: >60 mL/min (ref >60)
GFR calc non Af Amer: 58 mL/min — ABNORMAL LOW (ref >60)
GLUCOSE: 120 mg/dL — AB (ref 70–99)
Potassium: 3.5 mmol/L (ref 3.5–5.1)
Sodium: 139 mmol/L (ref 135–145)

## 2017-12-01 LAB — LACTIC ACID, PLASMA
Lactic Acid, Venous: 1.1 mmol/L (ref 0.5–1.9)
Lactic Acid, Venous: 1.2 mmol/L (ref 0.5–1.9)

## 2017-12-01 LAB — URINALYSIS, COMPLETE (UACMP) WITH MICROSCOPIC
Bilirubin Urine: NEGATIVE
GLUCOSE, UA: NEGATIVE mg/dL
KETONES UR: 20 mg/dL — AB
NITRITE: NEGATIVE
PROTEIN: 100 mg/dL — AB
Specific Gravity, Urine: 1.023 (ref 1.005–1.030)
pH: 5 (ref 5.0–8.0)

## 2017-12-01 LAB — URINE DRUG SCREEN, QUALITATIVE (ARMC ONLY)
Amphetamines, Ur Screen: NOT DETECTED
Barbiturates, Ur Screen: NOT DETECTED
CANNABINOID 50 NG, UR ~~LOC~~: NOT DETECTED
Cocaine Metabolite,Ur ~~LOC~~: NOT DETECTED
MDMA (Ecstasy)Ur Screen: NOT DETECTED
Methadone Scn, Ur: NOT DETECTED
Opiate, Ur Screen: NOT DETECTED
PHENCYCLIDINE (PCP) UR S: NOT DETECTED
Tricyclic, Ur Screen: NOT DETECTED

## 2017-12-01 LAB — BLOOD GAS, ARTERIAL
ACID-BASE DEFICIT: 4.5 mmol/L — AB (ref 0.0–2.0)
Bicarbonate: 24.1 mmol/L (ref 20.0–28.0)
FIO2: 1
Mechanical Rate: 14
O2 Saturation: 81.1 %
PATIENT TEMPERATURE: 37
PEEP: 8 cmH2O
PH ART: 7.22 — AB (ref 7.350–7.450)
VT: 500 mL
pCO2 arterial: 59 mmHg — ABNORMAL HIGH (ref 32.0–48.0)
pO2, Arterial: 55 mmHg — ABNORMAL LOW (ref 83.0–108.0)

## 2017-12-01 LAB — PROTIME-INR
INR: 1.1
Prothrombin Time: 14.1 seconds (ref 11.4–15.2)

## 2017-12-01 LAB — MAGNESIUM: Magnesium: 2.3 mg/dL (ref 1.7–2.4)

## 2017-12-01 LAB — PROCALCITONIN: PROCALCITONIN: 0.65 ng/mL

## 2017-12-01 LAB — CBC
HCT: 40.6 % (ref 35.0–47.0)
Hemoglobin: 13.8 g/dL (ref 12.0–16.0)
MCH: 29.1 pg (ref 26.0–34.0)
MCHC: 33.9 g/dL (ref 32.0–36.0)
MCV: 85.8 fL (ref 80.0–100.0)
PLATELETS: 426 10*3/uL (ref 150–440)
RBC: 4.74 MIL/uL (ref 3.80–5.20)
RDW: 13.4 % (ref 11.5–14.5)
WBC: 13.8 10*3/uL — ABNORMAL HIGH (ref 3.6–11.0)

## 2017-12-01 LAB — GLUCOSE, CAPILLARY
GLUCOSE-CAPILLARY: 150 mg/dL — AB (ref 70–99)
GLUCOSE-CAPILLARY: 168 mg/dL — AB (ref 70–99)
Glucose-Capillary: 134 mg/dL — ABNORMAL HIGH (ref 70–99)
Glucose-Capillary: 139 mg/dL — ABNORMAL HIGH (ref 70–99)

## 2017-12-01 LAB — APTT: APTT: 27 s (ref 24–36)

## 2017-12-01 LAB — PHOSPHORUS: Phosphorus: 5.9 mg/dL — ABNORMAL HIGH (ref 2.5–4.6)

## 2017-12-01 LAB — LIPASE, BLOOD: Lipase: 46 U/L (ref 11–51)

## 2017-12-01 MED ORDER — SODIUM CHLORIDE 0.9 % IV BOLUS: 1000.0000 mL | Freq: Once | INTRAVENOUS | Status: DC

## 2017-12-01 MED ORDER — GUAIFENESIN 100 MG/5ML PO SOLN
5.0000 mL | ORAL | Status: DC | PRN
  Administered 2017-12-01: 100 mg via ORAL
  Filled 2017-12-01 (×2): qty 5

## 2017-12-01 MED ORDER — PIPERACILLIN-TAZOBACTAM 3.375 G IVPB
3.3750 g | Freq: Three times a day (TID) | INTRAVENOUS | Status: DC
  Administered 2017-12-01 – 2017-12-03 (×8): 3.375 g via INTRAVENOUS
  Filled 2017-12-01 (×8): qty 50

## 2017-12-01 MED ORDER — TRAZODONE HCL 50 MG PO TABS
50.0000 mg | ORAL_TABLET | Freq: Every day | ORAL | Status: DC
Start: 2017-12-01 — End: 2017-12-02
  Administered 2017-12-01: 02:00:00 50 mg via ORAL
  Filled 2017-12-01: qty 1

## 2017-12-01 MED ORDER — FERROUS SULFATE 325 (65 FE) MG PO TABS
325.0000 mg | ORAL_TABLET | Freq: Every day | ORAL | Status: DC
  Administered 2017-12-01: 325 mg via ORAL
  Filled 2017-12-01: qty 1

## 2017-12-01 MED ORDER — ETOMIDATE 2 MG/ML IV SOLN
INTRAVENOUS | Status: AC
  Administered 2017-12-01: 20 mg via INTRAVENOUS
  Filled 2017-12-01: qty 10

## 2017-12-01 MED ORDER — IPRATROPIUM-ALBUTEROL 0.5-2.5 (3) MG/3ML IN SOLN
RESPIRATORY_TRACT | Status: AC
  Administered 2017-12-01: 08:00:00 3 mL
  Filled 2017-12-01: qty 3

## 2017-12-01 MED ORDER — IOPAMIDOL (ISOVUE-300) INJECTION 61%
100.0000 mL | Freq: Once | INTRAVENOUS | Status: AC | PRN
  Administered 2017-12-01: 100 mL via INTRAVENOUS

## 2017-12-01 MED ORDER — FENTANYL 2500MCG IN NS 250ML (10MCG/ML) PREMIX INFUSION
INTRAVENOUS | Status: AC
  Administered 2017-12-01: 150 ug/h via INTRAVENOUS
  Filled 2017-12-01: qty 250

## 2017-12-01 MED ORDER — LORAZEPAM 1 MG PO TABS: 1.0000 mg | ORAL_TABLET | Freq: Four times a day (QID) | ORAL | Status: DC | PRN

## 2017-12-01 MED ORDER — FENTANYL 2500MCG IN NS 250ML (10MCG/ML) PREMIX INFUSION
25.0000 ug/h | INTRAVENOUS | Status: DC
  Administered 2017-12-01: 150 ug/h via INTRAVENOUS
  Administered 2017-12-02: 100 ug/h via INTRAVENOUS
  Administered 2017-12-02: 250 ug/h via INTRAVENOUS
  Administered 2017-12-02: 350 ug/h via INTRAVENOUS
  Administered 2017-12-03: 250 ug/h via INTRAVENOUS
  Filled 2017-12-01 (×4): qty 250

## 2017-12-01 MED ORDER — SODIUM CHLORIDE 0.9 % IV SOLN
2.0000 g | Freq: Two times a day (BID) | INTRAVENOUS | Status: DC
  Filled 2017-12-01 (×2): qty 2

## 2017-12-01 MED ORDER — HEPARIN SODIUM (PORCINE) 5000 UNIT/ML IJ SOLN
5000.0000 [IU] | Freq: Three times a day (TID) | INTRAMUSCULAR | Status: DC
  Administered 2017-12-01 – 2017-12-03 (×8): 5000 [IU] via SUBCUTANEOUS
  Filled 2017-12-01 (×8): qty 1

## 2017-12-01 MED ORDER — ACETAMINOPHEN 650 MG RE SUPP
650.0000 mg | Freq: Four times a day (QID) | RECTAL | Status: DC | PRN
  Administered 2017-12-02: 650 mg via RECTAL
  Filled 2017-12-01: qty 1

## 2017-12-01 MED ORDER — VANCOMYCIN HCL 10 G IV SOLR
1250.0000 mg | INTRAVENOUS | Status: DC
  Administered 2017-12-01 – 2017-12-02 (×2): 1250 mg via INTRAVENOUS
  Filled 2017-12-01 (×3): qty 1250

## 2017-12-01 MED ORDER — VITAMIN B-12 100 MCG PO TABS
100.0000 ug | ORAL_TABLET | Freq: Every day | ORAL | Status: DC
  Administered 2017-12-01: 100 ug via ORAL
  Filled 2017-12-01: qty 1

## 2017-12-01 MED ORDER — SODIUM CHLORIDE 0.9 % IV BOLUS
1000.0000 mL | Freq: Once | INTRAVENOUS | Status: AC
  Administered 2017-12-01: 1000 mL via INTRAVENOUS

## 2017-12-01 MED ORDER — TRAZODONE HCL 50 MG PO TABS: 25.0000 mg | ORAL_TABLET | Freq: Every evening | ORAL | Status: DC | PRN

## 2017-12-01 MED ORDER — FAMOTIDINE IN NACL 20-0.9 MG/50ML-% IV SOLN
20.0000 mg | INTRAVENOUS | Status: DC
  Administered 2017-12-02: 20 mg via INTRAVENOUS
  Filled 2017-12-01: qty 50

## 2017-12-01 MED ORDER — SODIUM CHLORIDE 0.9 % IV SOLN
0.0000 ug/min | INTRAVENOUS | Status: DC
  Administered 2017-12-01: 30 ug/min via INTRAVENOUS
  Administered 2017-12-02: 400 ug/min via INTRAVENOUS
  Filled 2017-12-01 (×2): qty 10

## 2017-12-01 MED ORDER — MULTI-VITAMIN/MINERALS PO TABS: 1.0000 | ORAL_TABLET | Freq: Every day | ORAL | Status: DC

## 2017-12-01 MED ORDER — ONDANSETRON HCL 4 MG/2ML IJ SOLN: 4.0000 mg | Freq: Four times a day (QID) | INTRAMUSCULAR | Status: DC | PRN

## 2017-12-01 MED ORDER — SODIUM CHLORIDE 0.9 % IV SOLN
INTRAVENOUS | Status: DC
  Administered 2017-12-01 (×2): via INTRAVENOUS

## 2017-12-01 MED ORDER — BISACODYL 5 MG PO TBEC: 5.0000 mg | DELAYED_RELEASE_TABLET | Freq: Every day | ORAL | Status: DC | PRN

## 2017-12-01 MED ORDER — ALUM & MAG HYDROXIDE-SIMETH 200-200-20 MG/5ML PO SUSP
30.0000 mL | ORAL | Status: DC | PRN
  Filled 2017-12-01: qty 30

## 2017-12-01 MED ORDER — ESCITALOPRAM OXALATE 10 MG PO TABS
10.0000 mg | ORAL_TABLET | Freq: Every day | ORAL | Status: DC
  Administered 2017-12-01: 10 mg via ORAL
  Filled 2017-12-01 (×3): qty 1

## 2017-12-01 MED ORDER — SERTRALINE HCL 50 MG PO TABS: 100.0000 mg | ORAL_TABLET | Freq: Every day | ORAL | Status: DC

## 2017-12-01 MED ORDER — ACETAMINOPHEN 325 MG PO TABS
650.0000 mg | ORAL_TABLET | Freq: Four times a day (QID) | ORAL | Status: DC | PRN
  Administered 2017-12-01 (×2): 650 mg via ORAL
  Filled 2017-12-01 (×3): qty 2

## 2017-12-01 MED ORDER — VANCOMYCIN HCL IN DEXTROSE 1-5 GM/200ML-% IV SOLN
1000.0000 mg | Freq: Once | INTRAVENOUS | Status: AC
  Administered 2017-12-01: 1000 mg via INTRAVENOUS
  Filled 2017-12-01: qty 200

## 2017-12-01 MED ORDER — LACTATED RINGERS IV SOLN
INTRAVENOUS | Status: DC
  Administered 2017-12-01 – 2017-12-03 (×4): via INTRAVENOUS

## 2017-12-01 MED ORDER — FENTANYL CITRATE (PF) 100 MCG/2ML IJ SOLN: 50.0000 ug | Freq: Once | INTRAMUSCULAR | Status: DC

## 2017-12-01 MED ORDER — ONDANSETRON HCL 4 MG PO TABS: 4.0000 mg | ORAL_TABLET | Freq: Four times a day (QID) | ORAL | Status: DC | PRN

## 2017-12-01 MED ORDER — IPRATROPIUM-ALBUTEROL 0.5-2.5 (3) MG/3ML IN SOLN
3.0000 mL | RESPIRATORY_TRACT | Status: DC | PRN
  Administered 2017-12-01 (×2): 3 mL via RESPIRATORY_TRACT
  Filled 2017-12-01 (×2): qty 3

## 2017-12-01 MED ORDER — FENTANYL CITRATE (PF) 100 MCG/2ML IJ SOLN
100.0000 ug | Freq: Once | INTRAMUSCULAR | Status: AC
  Administered 2017-12-01: 100 ug via INTRAVENOUS

## 2017-12-01 MED ORDER — BUSPIRONE HCL 10 MG PO TABS
10.0000 mg | ORAL_TABLET | Freq: Three times a day (TID) | ORAL | Status: DC
  Administered 2017-12-01: 10 mg via ORAL
  Filled 2017-12-01 (×8): qty 1

## 2017-12-01 MED ORDER — FENTANYL BOLUS VIA INFUSION
50.0000 ug | INTRAVENOUS | Status: DC | PRN
  Filled 2017-12-01: qty 50

## 2017-12-01 MED ORDER — FENTANYL CITRATE (PF) 100 MCG/2ML IJ SOLN
INTRAMUSCULAR | Status: AC
  Administered 2017-12-01: 100 ug via INTRAVENOUS
  Filled 2017-12-01: qty 2

## 2017-12-01 MED ORDER — PANTOPRAZOLE SODIUM 40 MG PO TBEC
40.0000 mg | DELAYED_RELEASE_TABLET | Freq: Two times a day (BID) | ORAL | Status: DC
  Administered 2017-12-01 (×2): 40 mg via ORAL
  Filled 2017-12-01 (×2): qty 1

## 2017-12-01 MED ORDER — IPRATROPIUM-ALBUTEROL 0.5-2.5 (3) MG/3ML IN SOLN: 3.0000 mL | Freq: Four times a day (QID) | RESPIRATORY_TRACT | Status: DC | PRN

## 2017-12-01 MED ORDER — SERTRALINE HCL 50 MG PO TABS: 50.0000 mg | ORAL_TABLET | Freq: Every day | ORAL | Status: DC

## 2017-12-01 MED ORDER — DEXMEDETOMIDINE HCL IN NACL 400 MCG/100ML IV SOLN
0.4000 ug/kg/h | INTRAVENOUS | Status: DC
  Administered 2017-12-02: 0.4 ug/kg/h via INTRAVENOUS
  Filled 2017-12-01: qty 100

## 2017-12-01 MED ORDER — DOCUSATE SODIUM 100 MG PO CAPS
100.0000 mg | ORAL_CAPSULE | Freq: Two times a day (BID) | ORAL | Status: DC
  Administered 2017-12-01: 100 mg via ORAL
  Filled 2017-12-01: qty 1

## 2017-12-01 MED ORDER — PREMIER PROTEIN SHAKE: 11.0000 [oz_av] | Freq: Two times a day (BID) | ORAL | Status: DC

## 2017-12-01 MED ORDER — ETOMIDATE 2 MG/ML IV SOLN
20.0000 mg | Freq: Once | INTRAVENOUS | Status: AC
  Administered 2017-12-01: 20 mg via INTRAVENOUS

## 2017-12-01 NOTE — Progress Notes (Signed)
E-Link notified of patients elevated BP. Fentanyl gtt at 200 mcg, pt continues to bite on ET tube with oral care. Discussed increasing sedation with Precedex if no improvement with Fentanyl.

## 2017-12-01 NOTE — Progress Notes (Signed)
On-call MD (Salary) paged and notified of third episode of shortness of breath and anxiety this shift. IV Ativan, breathing treatment given with minimal improvement. Oxygen increased to 5L. Verbal orders given by MD for STAT ABG, STAT port chest xray, and to discontinue IV fluids. Will continue to monitor patient closely. Madlyn Frankel, RN

## 2017-12-01 NOTE — Consult Note (Addendum)
Pharmacy Antibiotic Note  Deanna Haynes is a 52 y.o. female admitted on 11/12/2017 with pneumonia/HCAP.  Pharmacy has been consulted for Vancomycin and Zosyn dosing.  Plan: Ke: 0.054   T1/2: 12.9   Vd: 54.6     Vancomycin 1g IV x 1 dose, followed by Vancomycin 1250 IV every 18 hours with 6 hour stack dosing.  Goal trough 15-20 mcg/mL. Calculated trough at Css is 15.3. Trough level prior to 4 dose. MRSA PCR ordered. If negative, recommend discontinuation of vancomycin.  Start Zosyn 3.375 EI IV every 8 hours.   Height: 5\' 4"  (162.6 cm) Weight: 173 lb 1 oz (78.5 kg) IBW/kg (Calculated) : 54.7  Temp (24hrs), Avg:98.1 F (36.7 C), Min:97.8 F (36.6 C), Max:98.4 F (36.9 C)  Recent Labs  Lab 11/27/17 1145 11/28/17 0341 11/29/17 0424 11/11/2017 2048  WBC 15.4* 12.2*  --  14.6*  CREATININE 0.97 0.60 0.49 1.13*    Estimated Creatinine Clearance: 57.7 mL/min (A) (by C-G formula based on SCr of 1.13 mg/dL (H)).    Allergies  Allergen Reactions  . Codeine Nausea And Vomiting    Antimicrobials this admission: 9/1 Zosyn >>  9/1 Vancomycin  >>   Microbiology results: 9/1  MRSA PCR: pending   Thank you for allowing pharmacy to be a part of this patient's care.  Pernell Dupre, PharmD, BCPS Clinical Pharmacist 12/01/2017 3:41 AM

## 2017-12-01 NOTE — Consult Note (Addendum)
Name: Deanna Haynes MRN: [id number] DOB: May 06, 1965     CONSULTATION DATE: 11/04/2017   HISTORY OF PRESENT ILLNESS:  52 years old lady with history of cervical ca. status post radical abdominal hysterectomy + chemotherapy, alcohol abuse, recent hospitalization with alcoholic ketoacidosis.  Patient presented to the emergency room with shortness of breath cough generalized weakness she was found to be hypotensive with systolic blood pressure in 80s, desaturation to 90% on room air with positive UTI and opacities on chest x-ray diagnosed as pneumonia.  While in the floor she was noticed to have low O2 sat with increased work of breathing Patient arrived to ICU in severe respiratory distress on nasal cannula. All history was obtained from nursing staff, patient sister at the bedside and EMR.  PAST MEDICAL HISTORY :   has a past medical history of Anxiety, Cervical cancer ([place]), and Vaginal cuff dehiscence.  has a past surgical history that includes Radical hysterectomy (11/26/2014); Repair vaginal cuff; and Abdominal hysterectomy. Prior to Admission medications   Medication Sig Start Date End Date Taking? Authorizing Provider  alum & mag hydroxide-simeth (MAALOX/MYLANTA) 200-200-20 MG/5ML suspension Take 30 mLs by mouth every 4 (four) hours as needed for indigestion or heartburn. 11/28/17  Yes Demetrios Loll, MD  clonazePAM (KLONOPIN) 0.5 MG tablet Take 0.5 mg by mouth daily.   Yes [provider]  ferrous sulfate 325 (65 FE) MG tablet Take 325 mg by mouth daily with breakfast.   Yes [provider]  metoprolol tartrate (LOPRESSOR) 25 MG tablet Take 1 tablet (25 mg total) by mouth 2 (two) times daily. 11/29/17  Yes Demetrios Loll, MD  Multiple Vitamins-Minerals (MULTIVITAMIN WITH MINERALS) tablet Take 1 tablet by mouth daily.   Yes [provider]  pantoprazole (PROTONIX) 40 MG tablet Take 1 tablet (40 mg total) by mouth 2 (two) times daily before a meal. 11/28/17  Yes Demetrios Loll, MD  promethazine (PHENERGAN) 25 MG tablet Take 25 mg by mouth every 6 (six) hours as needed for nausea/vomiting. 11/26/17  Yes [provider]  rOPINIRole (REQUIP) 0.5 MG tablet Take 0.5 mg by mouth at bedtime. 11/14/17  Yes [provider]  sertraline (ZOLOFT) 100 MG tablet Take 100 mg by mouth at bedtime. 11/14/17  Yes [provider]  traZODone (DESYREL) 50 MG tablet Take 50 mg by mouth at bedtime.   Yes [provider]  vitamin B-12 (CYANOCOBALAMIN) 100 MCG tablet Take 100 mcg by mouth daily.   Yes [provider]   Allergies  Allergen Reactions  . Codeine Nausea And Vomiting    FAMILY HISTORY:  family history includes Anxiety disorder in her mother; Cervical cancer in her mother. SOCIAL HISTORY:  reports that she quit smoking about 3 years ago. Her smoking use included cigarettes. She has a 20.00 pack-year smoking history. She has never used smokeless tobacco. She reports that she drinks alcohol. She reports that she does not use drugs.  REVIEW OF SYSTEMS:   Unable to obtain due to critical illness   VITAL SIGNS: Temp:  [97.4 F (36.3 C)-98.6 F (37 C)] 98.6 F (37 C) (09/01 1352) Pulse Rate:  [106-120] 120 (09/01 1905) Resp:  [18-38] 28 (09/01 1905) BP: (107-132)/(69-98) 114/89 (09/01 1905) SpO2:  [89 %-97 %] 92 % (09/01 1905) FiO2 (%):  [100 %] 100 % (09/01 2000) Weight:  [78.5 kg] 78.5 kg (08/31 2339)  Physical Examination:  Awake, lethargic, laying all extremities and nonfocal neurological exam On nasal cannula, severe respiratory distress, bilateral equal air  entry and no adventitious sounds S1 & S2 are audible with no murmur Large abdominal distention, no tenderness, no rebound and silent abdomen Mottled extremities and no peripheral edema. Palpable PP  ASSESSMENT / PLAN:  Acute respiratory failure with respiratory distress.  And the patient had to be emergently intubated (see procedure note). -ABG, optimize  ventilator settings and continuous vent support  Altered mental status with lethargy and nonfocal neurological exam.  More likely toxic metabolic encephalopathy. -Monitor neuro status  Aspiration pneumonia.  Bilateral airspace disease on her chest x-ray -Empiric Zosyn + vancomycin.  Monitor CXR + CBC + FiO2  Septic shock. -Optimize hydration, pressors + hydrocortisone, monitor hemodynamics, procalcitonin and lactic acid.  Ileus.5 L of bilious secretions was suctioned as an output from orogastric tube after intubation.  High-grade small bowel obstruction on abdominal CT likely due to adhesions -OGT to suction -N.p.o., continue to monitor lactic acid -Surgical evaluation  AKI and prerenal azotemia with intravascular volume depletion and ATN due to sepsis -Optimize hydration, hemodynamics, avoid nephrotoxins, monitor renal panel and urine output.  Mottling of fingers and toes with sepsis -wean off pressers as tolerated, optimize hemodynamics, topical NTG and consider vascular consult if no PP. -Monitor lactic acid.   Alcohol dependence -Thiamine and folic acid and watch for DTs  History of cervical cancer status post radical hysterectomy and chemotherapy.  Subcentimeter pancreatic lesion on the CT scan.  Also noticed to have vaginal spotting. -Pelvic ultrasound and consider OB/GYN and oncology consult once stable.  Full code  DVT & GI prophylaxis.  Continue with supportive care Patient sister was updated at the bedside she was made aware of the patient's serious condition and she agreed to the plan of care Critical care time 60 minutes

## 2017-12-01 NOTE — Progress Notes (Signed)
Initial Nutrition Assessment  DOCUMENTATION CODES:   Not applicable  INTERVENTION:   Premier Protein BID, each supplement provides 160 kcal and 30 grams of protein.   MVI, thiamine and folic acid in setting of etoh abuse  Liberalize diet   NUTRITION DIAGNOSIS:   Increased nutrient needs related to acute illness(HCAP) as evidenced by icnreased estimated needs.  GOAL:   Patient will meet greater than or equal to 90% of their needs  MONITOR:   PO intake, Supplement acceptance, Labs, Weight trends, Skin, I & O's  REASON FOR ASSESSMENT:   Malnutrition Screening Tool    ASSESSMENT:   52 y.o. female with a known history of alcohol abuse and dependence.  Patient was recently hospitalized for alcoholic ketoacidosis and discharged 8/30. Pt admitted for hypotension, AKI, UTI and HCAP    Visited pt's room today. Unable to speak with pt as multiple nurses in room working with pt. Per chart, pt with weight gain pta. Pt appears well nourished today. Pt documented to be eating 100% of meals in hospital. Pt on CIWA. RD will order supplements to help pt meet her estimated needs. RD will also liberalize pt's diet as a heart healthy diet restricts protein. Would recommend check Mg and P in setting of etoh abuse; pt likely at refeeding risk.   Medications reviewed and include: colace, ferrous sulfate, folic acid, heparin, MVI, protonix, thiamine, B12, NaCl @100ml /hr, zosyn, vancomycin   Labs reviewed: K 3.5 wnl, BUN 37(H), creat 1.07(H) Wbc- 13.8(H)  NUTRITION - FOCUSED PHYSICAL EXAM:    Most Recent Value  Orbital Region  No depletion  Upper Arm Region  No depletion  Thoracic and Lumbar Region  No depletion  Buccal Region  No depletion  Temple Region  No depletion  Clavicle Bone Region  No depletion  Clavicle and Acromion Bone Region  No depletion  Scapular Bone Region  No depletion  Dorsal Hand  No depletion  Patellar Region  No depletion  Anterior Thigh Region  No depletion   Posterior Calf Region  No depletion  Edema (RD Assessment)  None  Hair  Reviewed  Eyes  Reviewed  Mouth  Reviewed  Skin  Reviewed  Nails  Reviewed     Diet Order:   Diet Order            Diet Heart Room service appropriate? Yes; Fluid consistency: Thin  Diet effective now             EDUCATION NEEDS:   Not appropriate for education at this time  Skin:  Skin Assessment: Reviewed RN Assessment  Last BM:  9/1- type 4  Height:   Ht Readings from Last 1 Encounters:  11/04/2017 5\' 4"  (1.626 m)    Weight:   Wt Readings from Last 1 Encounters:  10/31/2017 78.5 kg    Ideal Body Weight:  54.5 kg  BMI:  Body mass index is 29.71 kg/m.  Estimated Nutritional Needs:   Kcal:  1600-1800kcal/day   Protein:  78-87g/day   Fluid:  >1.6L/day   Koleen Distance MS, RD, LDN Pager #- [phone] Office#- [phone] After Hours Pager: [phone]

## 2017-12-01 NOTE — Progress Notes (Signed)
E-Link notifed for BP 74/40

## 2017-12-01 NOTE — Progress Notes (Addendum)
[facility] [place] NAME: Deanna Haynes    MR#:  [id number]  DATE OF BIRTH:  08/15/1965  SUBJECTIVE:  CHIEF COMPLAINT:   Chief Complaint  Patient presents with  . Hypotension  . Weakness   The patient has dry cough and the shortness of breath, hypoxia on oxygen by nasal cannula 3 L.  She feels depressed and nervous. REVIEW OF SYSTEMS:  Review of Systems  Constitutional: Positive for malaise/fatigue. Negative for chills and fever.  HENT: Negative for sore throat.   Eyes: Negative for blurred vision and double vision.  Respiratory: Positive for cough and shortness of breath. Negative for hemoptysis, sputum production, wheezing and stridor.   Cardiovascular: Negative for chest pain, palpitations, orthopnea and leg swelling.  Gastrointestinal: Negative for abdominal pain, blood in stool, diarrhea, melena, nausea and vomiting.  Genitourinary: Positive for frequency. Negative for dysuria, flank pain, hematuria and urgency.  Musculoskeletal: Negative for back pain and joint pain.  Skin: Negative for rash.  Neurological: Negative for dizziness, sensory change, focal weakness, seizures, loss of consciousness, weakness and headaches.  Endo/Heme/Allergies: Negative for polydipsia.  Psychiatric/Behavioral: Positive for depression. The patient is nervous/anxious.     DRUG ALLERGIES:   Allergies  Allergen Reactions  . Codeine Nausea And Vomiting   VITALS:  Blood pressure 121/76, pulse (!) 120, temperature 97.7 F (36.5 C), temperature source Oral, resp. rate (!) 24, height 5\' 4"  (1.626 m), weight 78.5 kg, SpO2 (!) 89 %. PHYSICAL EXAMINATION:  Physical Exam  Constitutional: She is oriented to person, place, and time. She appears well-developed.  HENT:  Head: Normocephalic.  Mouth/Throat: Oropharynx is clear and moist.  Eyes: Pupils are equal, round, and reactive to light. Conjunctivae and EOM are normal. No scleral icterus.  Neck:  Normal range of motion. Neck supple. No JVD present. No tracheal deviation present.  Cardiovascular: Normal rate, regular rhythm and normal heart sounds. Exam reveals no gallop.  No murmur heard. Pulmonary/Chest: Effort normal. No respiratory distress. She has wheezes. She has no rales.  Abdominal: Soft. Bowel sounds are normal. She exhibits no distension. There is no tenderness. There is no rebound.  Musculoskeletal: Normal range of motion. She exhibits no edema or tenderness.  Neurological: She is alert and oriented to person, place, and time. No cranial nerve deficit.  Skin: No rash noted. No erythema.  Psychiatric: She has a normal mood and affect.   LABORATORY PANEL:  Female CBC Recent Labs  Lab 12/01/17 0425  WBC 13.8*  HGB 13.8  HCT 40.6  PLT 426   ------------------------------------------------------------------------------------------------------------------ Chemistries  Recent Labs  Lab 11/28/17 0341  11/05/2017 2048 12/01/17 0425  NA 140   < > 137 139  K 3.1*   < > 3.8 3.5  CL 105   < > 100 102  CO2 24   < > 23 25  GLUCOSE 152*   < > 148* 120*  BUN 29*   < > 33* 37*  CREATININE 0.60   < > 1.13* 1.07*  CALCIUM 8.5*   < > 9.0 8.4*  MG 2.0  --   --   --   AST  --   --  15  --   ALT  --   --  13  --   ALKPHOS  --   --  50  --   BILITOT  --    < > 1.3*  --    < > = values in this interval not displayed.  RADIOLOGY:  Dg Chest 1 View  Result Date: 11/17/2017 CLINICAL DATA:  Patient with weakness and hypotension. EXAM: CHEST  1 VIEW COMPARISON:  None. FINDINGS: Monitoring leads overlie the patient. Normal cardiac and mediastinal contours. Heterogeneous opacities right mid lower lung and left lung base. No pleural effusion or pneumothorax. IMPRESSION: Heterogeneous opacities right mid lower lung and left lung base may represent pneumonia in the appropriate clinical setting. Followup PA and lateral chest X-ray is recommended in 3-4 weeks following trial of antibiotic  therapy to ensure resolution and exclude underlying malignancy. Electronically Signed   By: Lovey Newcomer M.D.   On: 11/27/2017 22:07   ASSESSMENT AND PLAN:   1.  Sepsis (Hypotension, tachycardia, leukocytosis) due to HCP/aspiration pneumonia and UTI Continue Zosyn and vancomycin, nebulizer and Robitussin as needed.  Follow-up cultures.  2.    Acute respiratory failure with hypoxia due to HAP/aspiration pneumonia Continue oxygen by nasal cannula, DuoNeb as needed.  Robitussin as needed.  3.  ARF, likely prerenal, secondary to above.   Continue IV fluids and follow-up BMP.  Avoid nephrotoxic medications.  4.  Alcohol abuse.    On CIWA alcohol, which includes thiamine replacement.  5.  Hyportension.   continue IV fluids.  6.  Depression and anxiety.  Psych consult.  All the records are reviewed and case discussed with Care Management/Social Worker. Management plans discussed with the patient, her sister POA and they are in agreement.  CODE STATUS: Full Code  TOTAL TIME TAKING CARE OF THIS PATIENT: 38 minutes.   More than 50% of the time was spent in counseling/coordination of care: YES  POSSIBLE D/C IN 2-3 DAYS, DEPENDING ON CLINICAL CONDITION.   Demetrios Loll M.D on 12/01/2017 at 12:45 PM  Between 7am to 6pm - Pager - [phone]  After 6pm go to [email or web address] - [facility]

## 2017-12-01 NOTE — Progress Notes (Signed)
Pt with intermitent hypoxia all day per nursing staff, transferred to ICU for further care, ABG, CXR ordered, d/w intensivist.

## 2017-12-01 NOTE — Progress Notes (Signed)
[facility] Progress Note Patient Name: Deanna Haynes DOB: 03/06/1966 MRN: [id number]   Date of Service  12/01/2017  HPI/Events of Note  Multiple issues: 1. Hypoxia - Aspirated peri intubation. Suspect ALI. Sats decreased to 60's and now are 85% on P 16 and 2. Hypotension - BP = 74/47.  eICU Interventions  Will order: 1. Increase PEEP to 16. 2. Place A-line. 3. Phenylephrine IV infusion. Titrate to MAP >= 65.  4. Portable CXR now. 5. Monitor CVP now and Q 6 hours.  6. Dr. Soyla Murphy notified of need for A-line and his presence at bedside to stabilize the patient.      Intervention Category Major Interventions: Hypotension - evaluation and management;Hypoxemia - evaluation and management  Lysle Dingwall 12/01/2017, 11:27 PM

## 2017-12-01 NOTE — Progress Notes (Signed)
   12/01/17 2000  Clinical Encounter Type  Visited With Patient;Family  Visit Type Code;Initial;Spiritual support  Referral From Physician  Recommendations Follow-up as needed.  Spiritual Encounters  Spiritual Needs Prayer;Emotional;Other (Comment) (Support for sister Deanna Haynes.)  Stress Factors  Patient Stress Factors Health changes  Family Stress Factors Health changes Deanna Haynes fears losing her sister.)    Chaplain responded to Rapid Response Rm #125. Patient was receiving care and Chaplain was directed to assist her sister, Iva Boop, who was emotive and mobile. Chaplain spoke Deanna Haynes, led her in mindfulness breathing, and prayed for the patient. Both sisters are religious [facility]?) and spirit-filled. Deanna Haynes is the [facility] and expressed dismay at her sister's care over the last few days. Chaplain offered non-judgmental, active listening. After the patient was stabilized, she was moved to [facility]. Chaplain escorted her sister to the ICU waiting room and moved between the patient and sister, offering prayer and presence. As the patient was being intubated, Deanna Haynes decided to leave the hospital. Chaplain escorted her to the exit. Deanna Haynes may return later tonight or early Monday.

## 2017-12-01 NOTE — Progress Notes (Signed)
Rapid response called at 1900 due to patient continuing to decline despite interventions (anxiety, sob). RT unable to obtain previously ordered ABG. Awaiting xray to be completed.   Family remains at bedside, educated regarding need of rapid response.   Dr. Jerelyn Charles called and updated, order to transfer to ICU obtained.   Madlyn Frankel, RN

## 2017-12-01 NOTE — Progress Notes (Signed)
During shift, patients condition wax and waned. Patient would have alert periods, taking pills, up to [facility]. Patient would soon after become anxious and short of breath, which would improve with breathing treatment, emotional support, repositioning, and/or IV Ativan. Madlyn Frankel, RN

## 2017-12-01 NOTE — Consult Note (Signed)
[facility] Consult   Reason for Consult: Follow-up patient came into the hospital with confusion probably largely related to increased alcohol withdrawal and alcoholic ketoacidosis. Pt was seen by Dr. Weber Cooks on 11/29/17, and is re-consulted for depression and anxiety.  Referring Physician: Verdell Carmine Patient Identification: Deanna Haynes MRN:  [id number] Principal Diagnosis: <principal problem not specified> Diagnosis:   Patient Active Problem List   Diagnosis Date Noted  . Acute respiratory failure with hypoxia ([place]) [J96.01] 12/01/2017  . Hypotension [I95.9] 11/04/2017  . Intractable vomiting with nausea [R11.2]   . Gastroesophageal reflux disease [K21.9]   . 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 Alcohol abuse [F10.10] 11/27/2017  . Generalized anxiety disorder [F41.1] 11/27/2017  . Dehiscence of incision [T81.31XA] 02/01/2015  . Anxiety [F41.9] 12/03/2014  . Lump in thyroid [E07.9] 11/25/2014  . Malignant neoplasm of cervix ([place]) [C53.9] 11/24/2014  . Cervical cancer ([place]) [C53.9] 11/17/2014    Total Time spent with patient: 20 minutes  Subjective:   Deanna Haynes is a 52 y.o. female patient admitted with "I am feeling much better.Marland Kitchen  HPI: pt has hx of depression, anxiety and alcohol use disorder, along with multiple medical conditions including cervical cancer.  She is admitted for SOB and generalized weakness, and bound to have pneumonia. Psych is consulted again for depression and anxiety.   Pt was seen by Dr. Weber Cooks on 8/28 and 11/29/17 for alcohol detox. She told Dr. Weber Cooks that she uses alcohol to self-medicate for anxiety because her doctors don't give her enough Xanax.  Per [place] CSRS, pt has not gotten any Rx for xanax since Sept 2016.  She has been getting low dose of klonopin at 0.50m 1-2x a day from Oct 2016 to Dec 2018.  Then Klonopin was restarted just 2 weeks ago on 10/31/17.    Pt is seen in her room with her sister present.  She tells me that her  last alcohol drinks was about 2 weeks ago.  She said that she is feeling very anxious because she could not breathing.  Per MAR, she was already given ativan 26mx2 in the past 8 hours.  While she wants more ativan, she is informed that ativan can make her breathing worse, and BZD is just as addicting as alcohol, and recommend that she try not to take it.  She agreed to try something else for anxiety.  Both pt and sister feels that her depression has not been under control with zoloft 10026maily, and pt stated that at higher dose, she just doesn't have any feeling.  She said that she has not tried any other antidepressant.  Thus, we agreed to cross titrate to lexapro.  In the mean time, we will try buspar to anxiety.   Pt denied SI or feeling of hopeless.  Pt and sister were explained that risks of taking BZD in the longer term.   Sister is worried about pt's ability to live on her own or takes care of herself. Sister requested psych admission, and is informed that at this time, pt doesn't need inpatient psych. However, rehab at a SNF might be appropriate, if medical team and physical therapy team feel that is necessary.   Past Psychiatric History: History of chronic anxiety made worse by her cancer.  Recent alcohol abuse as a compensation for that has developed a life of its own.  No history of suicidality.   Past Medical History:  Past Medical History:  Diagnosis Date  . Anxiety   . Cervical cancer (  [place])    Radical Hysterectomy 11/26/14 with adjuvant chemoradiation  . Vaginal cuff dehiscence     Past Surgical History:  Procedure Laterality Date  . ABDOMINAL HYSTERECTOMY    . RADICAL HYSTERECTOMY  11/26/2014   Stage IB2 Cervical Cancer, adjuvant chemoradiation  . REPAIR VAGINAL CUFF     Family History:  Family History  Problem Relation Age of Onset  . Anxiety disorder Mother   . Cervical cancer Mother    Family Psychiatric  History: See previous note Social History:  Social History    Substance and Sexual Activity  Alcohol Use Yes   Comment:  3 bottles a wine a week. Last drank approx. 11/16/17     Social History   Substance and Sexual Activity  Drug Use No    Social History   Socioeconomic History  . Marital status: Single    Spouse name: Not on file  . Number of children: Not on file  . Years of education: Not on file  . Highest education level: Not on file  Occupational History  . Occupation: Quarry manager  Social Needs  . Financial resource strain: Not on file  . Food insecurity:    Worry: Not on file    Inability: Not on file  . Transportation needs:    Medical: Not on file    Non-medical: Not on file  Tobacco Use  . Smoking status: Former Smoker    Packs/day: 1.00    Years: 20.00    Pack years: 20.00    Types: Cigarettes    Last attempt to quit: 11/01/2014    Years since quitting: 3.0  . Smokeless tobacco: Never Used  Substance and Sexual Activity  . Alcohol use: Yes    Comment:  3 bottles a wine a week. Last drank approx. 11/16/17  . Drug use: No  . Sexual activity: Not on file  Lifestyle  . Physical activity:    Days per week: Not on file    Minutes per session: Not on file  . Stress: Not on file  Relationships  . Social connections:    Talks on phone: Not on file    Gets together: Not on file    Attends religious service: Not on file    Active member of club or organization: Not on file    Attends meetings of clubs or organizations: Not on file    Relationship status: Not on file  Other Topics Concern  . Not on file  Social History Narrative  . Not on file   Additional Social History:    Allergies:   Allergies  Allergen Reactions  . Codeine Nausea And Vomiting    Labs:  Results for orders placed or performed during the hospital encounter of 11/05/2017 (from the past 48 hour(s))  CBC with Differential     Status: Abnormal   Collection Time: 11/06/2017  8:48 PM  Result Value Ref Range   WBC 14.6 (H) 3.6 - 11.0 K/uL   RBC 5.09  3.80 - 5.20 MIL/uL   Hemoglobin 14.9 12.0 - 16.0 g/dL   HCT 43.1 35.0 - 47.0 %   MCV 84.6 80.0 - 100.0 fL   MCH 29.2 26.0 - 34.0 pg   MCHC 34.6 32.0 - 36.0 g/dL   RDW 13.2 11.5 - 14.5 %   Platelets 531 (H) 150 - 440 K/uL   Neutrophils Relative % 82 %   Neutro Abs 11.9 (H) 1.4 - 6.5 K/uL   Lymphocytes Relative 6 %  Lymphs Abs 0.9 (L) 1.0 - 3.6 K/uL   Monocytes Relative 12 %   Monocytes Absolute 1.7 (H) 0.2 - 0.9 K/uL   Eosinophils Relative 0 %   Eosinophils Absolute 0.0 0 - 0.7 K/uL   Basophils Relative 0 %   Basophils Absolute 0.0 0 - 0.1 K/uL    Comment: Performed at [facility], [address] [place], [place] [address]  Comprehensive metabolic panel     Status: Abnormal   Collection Time: 11/18/2017  8:48 PM  Result Value Ref Range   Sodium 137 135 - 145 mmol/L   Potassium 3.8 3.5 - 5.1 mmol/L   Chloride 100 98 - 111 mmol/L   CO2 23 22 - 32 mmol/L   Glucose, Bld 148 (H) 70 - 99 mg/dL   BUN 33 (H) 6 - 20 mg/dL   Creatinine, Ser 1.13 (H) 0.44 - 1.00 mg/dL   Calcium 9.0 8.9 - 10.3 mg/dL   Total Protein 6.5 6.5 - 8.1 g/dL   Albumin 3.3 (L) 3.5 - 5.0 g/dL   AST 15 15 - 41 U/L   ALT 13 0 - 44 U/L   Alkaline Phosphatase 50 38 - 126 U/L   Total Bilirubin 1.3 (H) 0.3 - 1.2 mg/dL   GFR calc non Af Amer 54 (L) >60 mL/min   GFR calc Af Amer >60 >60 mL/min    Comment: (NOTE) The eGFR has been calculated using the CKD EPI equation. This calculation has not been validated in all clinical situations. eGFR's persistently <60 mL/min signify possible Chronic Kidney Disease.    Anion gap 14 5 - 15    Comment: Performed at [facility], [address] [place], [place] [address]  Ethanol     Status: None   Collection Time: 11/22/2017  8:48 PM  Result Value Ref Range   Alcohol, Ethyl (B) <10 <10 mg/dL    Comment: (NOTE) Lowest detectable limit for serum alcohol is 10 mg/dL. For medical purposes only. Performed at [facility], [address]  [place], [place] [address]   Troponin I     Status: None   Collection Time: 11/20/2017  8:48 PM  Result Value Ref Range   Troponin I <0.03 <0.03 ng/mL    Comment: Performed at [facility], [address] [place], [place] [address]  Urinalysis, Complete w Microscopic     Status: Abnormal   Collection Time: 12/01/17  1:36 AM  Result Value Ref Range   Color, Urine AMBER (A) YELLOW    Comment: BIOCHEMICALS MAY BE AFFECTED BY COLOR   APPearance CLOUDY (A) CLEAR   Specific Gravity, Urine 1.023 1.005 - 1.030   pH 5.0 5.0 - 8.0   Glucose, UA NEGATIVE NEGATIVE mg/dL   Hgb urine dipstick LARGE (A) NEGATIVE   Bilirubin Urine NEGATIVE NEGATIVE   Ketones, ur 20 (A) NEGATIVE mg/dL   Protein, ur 100 (A) NEGATIVE mg/dL   Nitrite NEGATIVE NEGATIVE   Leukocytes, UA SMALL (A) NEGATIVE   RBC / HPF 21-50 0 - 5 RBC/hpf   WBC, UA 11-20 0 - 5 WBC/hpf   Bacteria, UA RARE (A) NONE SEEN   Squamous Epithelial / LPF 21-50 0 - 5   Mucus PRESENT    Hyaline Casts, UA PRESENT     Comment: Performed at [facility], [address] [place], [place] [address]  Urine Drug Screen, Qualitative ([facility] only)     Status: Abnormal   Collection Time: 12/01/17  1:36 AM  Result  Value Ref Range   Tricyclic, Ur Screen NONE DETECTED NONE DETECTED   Amphetamines, Ur Screen NONE DETECTED NONE DETECTED   MDMA (Ecstasy)Ur Screen NONE DETECTED NONE DETECTED   Cocaine Metabolite,Ur [place] NONE DETECTED NONE DETECTED   Opiate, Ur Screen NONE DETECTED NONE DETECTED   Phencyclidine (PCP) Ur S NONE DETECTED NONE DETECTED   Cannabinoid 50 Ng, Ur [place] NONE DETECTED NONE DETECTED   Barbiturates, Ur Screen NONE DETECTED NONE DETECTED   Benzodiazepine, Ur Scrn TEST NOT PERFORMED, REAGENT NOT AVAILABLE (A) NONE DETECTED   Methadone Scn, Ur NONE DETECTED NONE DETECTED    Comment: (NOTE) Tricyclics + metabolites, urine    Cutoff 1000 ng/mL Amphetamines + metabolites, urine  Cutoff 1000 ng/mL MDMA (Ecstasy), urine               Cutoff 500 ng/mL Cocaine Metabolite, urine          Cutoff 300 ng/mL Opiate + metabolites, urine        Cutoff 300 ng/mL Phencyclidine (PCP), urine         Cutoff 25 ng/mL Cannabinoid, urine                 Cutoff 50 ng/mL Barbiturates + metabolites, urine  Cutoff 200 ng/mL Benzodiazepine, urine              Cutoff 200 ng/mL Methadone, urine                   Cutoff 300 ng/mL The urine drug screen provides only a preliminary, unconfirmed analytical test result and should not be used for non-medical purposes. Clinical consideration and professional judgment should be applied to any positive drug screen result due to possible interfering substances. A more specific alternate chemical method must be used in order to obtain a confirmed analytical result. Gas chromatography / mass spectrometry (GC/MS) is the preferred confirmat ory method. Performed at [facility], [address] [place], [place] [address]   Basic metabolic panel     Status: Abnormal   Collection Time: 12/01/17  4:25 AM  Result Value Ref Range   Sodium 139 135 - 145 mmol/L   Potassium 3.5 3.5 - 5.1 mmol/L   Chloride 102 98 - 111 mmol/L   CO2 25 22 - 32 mmol/L   Glucose, Bld 120 (H) 70 - 99 mg/dL   BUN 37 (H) 6 - 20 mg/dL   Creatinine, Ser 1.07 (H) 0.44 - 1.00 mg/dL   Calcium 8.4 (L) 8.9 - 10.3 mg/dL   GFR calc non Af Amer 58 (L) >60 mL/min   GFR calc Af Amer >60 >60 mL/min    Comment: (NOTE) The eGFR has been calculated using the CKD EPI equation. This calculation has not been validated in all clinical situations. eGFR's persistently <60 mL/min signify possible Chronic Kidney Disease.    Anion gap 12 5 - 15    Comment: Performed at [facility], [address] [place], [place] [address]  CBC     Status: Abnormal   Collection Time: 12/01/17  4:25 AM  Result Value Ref Range   WBC 13.8 (H) 3.6 - 11.0 K/uL   RBC 4.74 3.80 - 5.20 MIL/uL   Hemoglobin 13.8 12.0 - 16.0 g/dL   HCT 40.6 35.0 -  47.0 %   MCV 85.8 80.0 - 100.0 fL   MCH 29.1 26.0 - 34.0 pg   MCHC 33.9 32.0 - 36.0 g/dL   RDW 13.4 11.5 - 14.5 %   Platelets  426 150 - 440 K/uL    Comment: Performed at [facility], [address] [place], [place] [address]  Procalcitonin - Baseline     Status: None   Collection Time: 12/01/17  4:25 AM  Result Value Ref Range   Procalcitonin 0.65 ng/mL    Comment:        Interpretation: PCT > 0.5 ng/mL and <= 2 ng/mL: Systemic infection (sepsis) is possible, but other conditions are known to elevate PCT as well. (NOTE)       Sepsis PCT Algorithm           Lower Respiratory Tract                                      Infection PCT Algorithm    ----------------------------     ----------------------------         PCT < 0.25 ng/mL                PCT < 0.10 ng/mL         Strongly encourage             Strongly discourage   discontinuation of antibiotics    initiation of antibiotics    ----------------------------     -----------------------------       PCT 0.25 - 0.50 ng/mL            PCT 0.10 - 0.25 ng/mL               OR       >80% decrease in PCT            Discourage initiation of                                            antibiotics      Encourage discontinuation           of antibiotics    ----------------------------     -----------------------------         PCT >= 0.50 ng/mL              PCT 0.26 - 0.50 ng/mL                AND       <80% decrease in PCT             Encourage initiation of                                             antibiotics       Encourage continuation           of antibiotics    ----------------------------     -----------------------------        PCT >= 0.50 ng/mL                  PCT > 0.50 ng/mL               AND         increase in PCT                  Strongly encourage  initiation of antibiotics    Strongly encourage escalation           of antibiotics                                      -----------------------------                                           PCT <= 0.25 ng/mL                                                 OR                                        > 80% decrease in PCT                                     Discontinue / Do not initiate                                             antibiotics Performed at [facility], [address] [place], [place] [address]   Glucose, capillary     Status: Abnormal   Collection Time: 12/01/17  8:28 AM  Result Value Ref Range   Glucose-Capillary 134 (H) 70 - 99 mg/dL  Glucose, capillary     Status: Abnormal   Collection Time: 12/01/17 11:42 AM  Result Value Ref Range   Glucose-Capillary 139 (H) 70 - 99 mg/dL    Current Facility-Administered Medications  Medication Dose Route Frequency Provider Last Rate Last Dose  . 0.9 %  sodium chloride infusion   Intravenous Continuous Demetrios Loll, MD 100 mL/hr at 12/01/17 1119    . acetaminophen (TYLENOL) tablet 650 mg  650 mg Oral Q6H PRN Amelia Jo, MD   650 mg at 12/01/17 3875   Or  . acetaminophen (TYLENOL) suppository 650 mg  650 mg Rectal Q6H PRN Amelia Jo, MD      . alum & mag hydroxide-simeth (MAALOX/MYLANTA) 200-200-20 MG/5ML suspension 30 mL  30 mL Oral Q4H PRN Amelia Jo, MD      . bisacodyl (DULCOLAX) EC tablet 5 mg  5 mg Oral Daily PRN Amelia Jo, MD      . busPIRone (BUSPAR) tablet 10 mg  10 mg Oral TID Llewellyn Choplin, MD      . docusate sodium (COLACE) capsule 100 mg  100 mg Oral BID Amelia Jo, MD   100 mg at 12/01/17 1027  . escitalopram (LEXAPRO) tablet 10 mg  10 mg Oral Daily Nyara Capell, MD      . ferrous sulfate tablet 325 mg  325 mg Oral Q breakfast Amelia Jo, MD   325 mg at 12/01/17 0933  . folic acid (FOLVITE) tablet 1 mg  1 mg Oral Daily Amelia Jo, MD   1 mg at 12/01/17 0933  . guaiFENesin (ROBITUSSIN) 100  MG/5ML solution 100 mg  5 mL Oral Q4H PRN Demetrios Loll, MD   100 mg at 12/01/17 0945  . heparin injection 5,000 Units  5,000 Units  Subcutaneous Q8H Amelia Jo, MD   5,000 Units at 12/01/17 [phone]  . ipratropium-albuterol (DUONEB) 0.5-2.5 (3) MG/3ML nebulizer solution 3 mL  3 mL Nebulization Q4H PRN Demetrios Loll, MD      . LORazepam (ATIVAN) tablet 1 mg  1 mg Oral Q6H PRN Arelene Moroni, MD      . multivitamin with minerals tablet 1 tablet  1 tablet Oral Daily Amelia Jo, MD   1 tablet at 12/01/17 0934  . ondansetron (ZOFRAN) tablet 4 mg  4 mg Oral Q6H PRN Amelia Jo, MD       Or  . ondansetron [facility]) injection 4 mg  4 mg Intravenous Q6H PRN Amelia Jo, MD      . pantoprazole (PROTONIX) EC tablet 40 mg  40 mg Oral BID Hilaria Ota, MD   40 mg at 12/01/17 0934  . piperacillin-tazobactam (ZOSYN) IVPB 3.375 g  3.375 g Intravenous Q8H Pernell Dupre, RPH   Stopped at 12/01/17 9604  . sertraline (ZOLOFT) tablet 50 mg  50 mg Oral QHS Naryah Clenney, MD      . thiamine (VITAMIN B-1) tablet 100 mg  100 mg Oral Daily Amelia Jo, MD   100 mg at 12/01/17 1025   Or  . thiamine (B-1) injection 100 mg  100 mg Intravenous Daily Amelia Jo, MD      . traZODone (DESYREL) tablet 25 mg  25 mg Oral QHS PRN Amelia Jo, MD      . traZODone (DESYREL) tablet 50 mg  50 mg Oral QHS Amelia Jo, MD   50 mg at 12/01/17 0149  . vancomycin (VANCOCIN) 1,250 mg in sodium chloride 0.9 % 250 mL IVPB  1,250 mg Intravenous Q18H Hallaji, Sheema M, RPH 166.7 mL/hr at 12/01/17 1212 1,250 mg at 12/01/17 1212  . vitamin B-12 (CYANOCOBALAMIN) tablet 100 mcg  100 mcg Oral Daily Amelia Jo, MD   100 mcg at 12/01/17 1027    Musculoskeletal: Strength & Muscle Tone: within normal limits Gait & Station: normal Patient leans: N/A  Psychiatric Specialty Exam: Physical Exam  Nursing note and vitals reviewed. Constitutional: She appears well-developed and well-nourished.  HENT:  Head: Normocephalic and atraumatic.  Eyes: Pupils are equal, round, and reactive to light. Conjunctivae are normal.  Neck: Normal range of motion.  Cardiovascular: Regular  rhythm and normal heart sounds.  Respiratory: Effort normal.  GI: Soft.  Musculoskeletal: Normal range of motion.  Neurological: She is alert.  Skin: Skin is warm and dry.  Psychiatric: Judgment normal. Her affect is blunt. Her speech is delayed. She is not agitated and not aggressive. Thought content is not paranoid. Cognition and memory are normal. She expresses no homicidal and no suicidal ideation.    Review of Systems  Constitutional: Negative.   HENT: Negative.   Eyes: Negative.   Respiratory: Negative.   Cardiovascular: Negative.   Gastrointestinal: Negative.   Musculoskeletal: Negative.   Skin: Negative.   Neurological: Negative.   Psychiatric/Behavioral: Negative.     Blood pressure 121/76, pulse (!) 120, temperature 97.7 F (36.5 C), temperature source Oral, resp. rate (!) 24, height 5' 4"  (1.626 m), weight 78.5 kg, SpO2 (!) 89 %.Body mass index is 29.71 kg/m.  General Appearance: Casual  Eye Contact:  Fair  Speech:  Slow  Volume:  Normal  Mood:  Euthymic  Affect:  Congruent  Thought Process:  Goal Directed  Orientation:  Full (Time, Place, and Person)  Thought Content:  Logical  Suicidal Thoughts:  No  Homicidal Thoughts:  No  Memory:  Immediate;   Fair Recent;   Fair Remote;   Fair  Judgement:  Fair  Insight:  Fair  Psychomotor Activity:  Normal  Concentration:  Concentration: Fair  Recall:  [employer] of Knowledge:  Fair  Language:  Fair  Akathisia:  No  Handed:  Right  AIMS (if indicated):     Assets:  Desire for Improvement Housing Resilience  ADL's:  Intact  Cognition:  WNL  Sleep:        Treatment Plan Summary: Medication management  Pt has chronic depression and likely under-treated anxiety, admitted for alcohol withdrawal and pneumonia recently. Pt has been self medicated her anxiety with increased alcohol consumption.  Recommend more outpatient [place] support after medically stabilized.  Avoid Benzos if possible.  No SI or HI.   Depression  and GAD -- cross taper from zoloft to lexparo per pt's and her sister's request -- reduce zoloft to 71m for 2 doses and start lexapro 111mdaily.  -- add buspar 1056mID.  -- reduced ativan prn   Alcohol use disorder -- last drink was 2 weeks ago, active withdrawal is less likely.  -- continue to provide ativan, at low dose.   -- no SI. No Indication for inpatient psych -- recommend outpatient mental health care with substance abuse counseling.  -- psych will follow in 1-2 days.   .  Disposition: Patient does not meet criteria for psychiatric inpatient admission. Supportive therapy provided about ongoing stressors. Discussed crisis plan, support from social network, calling 911, coming to the Emergency Department, and calling Suicide Hotline.  Tristan Bramble, MD 12/01/2017 12:15 PM

## 2017-12-01 NOTE — Progress Notes (Signed)
eLink Physician-Brief Progress Note Patient Name: Deanna Haynes DOB: 05-Feb-1966 MRN: [id number]   Date of Service  12/01/2017  HPI/Events of Note  52 yo female admitted to medical floor d/t aspiration pneumonia. Rapid response called d/t hypoxia. Patient now moved to ICU for observation and further management.  Sat = 92% on [place] O2 and RR = 21. VS otherwise stable. PCCM on call will see patient.   eICU Interventions  No new orders.      Intervention Category Evaluation Type: New Patient Evaluation  Lysle Dingwall 12/01/2017, 7:38 PM

## 2017-12-01 DEATH — deceased

## 2017-12-02 ENCOUNTER — Inpatient Hospital Stay: Payer: Managed Care, Other (non HMO)

## 2017-12-02 DIAGNOSIS — K5652 Intestinal adhesions [bands] with complete obstruction: Secondary | ICD-10-CM

## 2017-12-02 LAB — COMPREHENSIVE METABOLIC PANEL
ALBUMIN: 1.4 g/dL — AB (ref 3.5–5.0)
ALT: 16 U/L (ref 0–44)
ALT: 23 U/L (ref 0–44)
ANION GAP: 5 (ref 5–15)
AST: 48 U/L — AB (ref 15–41)
AST: 69 U/L — ABNORMAL HIGH (ref 15–41)
Albumin: 1.8 g/dL — ABNORMAL LOW (ref 3.5–5.0)
Alkaline Phosphatase: 31 U/L — ABNORMAL LOW (ref 38–126)
Alkaline Phosphatase: 37 U/L — ABNORMAL LOW (ref 38–126)
Anion gap: 9 (ref 5–15)
BUN: 40 mg/dL — AB (ref 6–20)
BUN: 46 mg/dL — ABNORMAL HIGH (ref 6–20)
CHLORIDE: 106 mmol/L (ref 98–111)
CHLORIDE: 113 mmol/L — AB (ref 98–111)
CO2: 20 mmol/L — ABNORMAL LOW (ref 22–32)
CO2: 23 mmol/L (ref 22–32)
Calcium: 5.7 mg/dL — CL (ref 8.9–10.3)
Calcium: 6.8 mg/dL — ABNORMAL LOW (ref 8.9–10.3)
Creatinine, Ser: 1.37 mg/dL — ABNORMAL HIGH (ref 0.44–1.00)
Creatinine, Ser: 1.84 mg/dL — ABNORMAL HIGH (ref 0.44–1.00)
GFR calc Af Amer: 50 mL/min — ABNORMAL LOW (ref >60)
GFR calc non Af Amer: 43 mL/min — ABNORMAL LOW (ref >60)
GFR calc non Af Amer: 30 mL/min — ABNORMAL LOW (ref >60)
GFR, EST AFRICAN AMERICAN: 35 mL/min — AB (ref >60)
GLUCOSE: 102 mg/dL — AB (ref 70–99)
GLUCOSE: 88 mg/dL (ref 70–99)
POTASSIUM: 3 mmol/L — AB (ref 3.5–5.1)
Potassium: 3.7 mmol/L (ref 3.5–5.1)
SODIUM: 138 mmol/L (ref 135–145)
Sodium: 138 mmol/L (ref 135–145)
Total Bilirubin: 0.5 mg/dL (ref 0.3–1.2)
Total Bilirubin: 0.7 mg/dL (ref 0.3–1.2)
Total Protein: 3.2 g/dL — ABNORMAL LOW (ref 6.5–8.1)
Total Protein: 4.1 g/dL — ABNORMAL LOW (ref 6.5–8.1)

## 2017-12-02 LAB — MAGNESIUM
Magnesium: 1.6 mg/dL — ABNORMAL LOW (ref 1.7–2.4)
Magnesium: 2.1 mg/dL (ref 1.7–2.4)
Magnesium: 2 mg/dL (ref 1.7–2.4)
Magnesium: 2 mg/dL (ref 1.7–2.4)

## 2017-12-02 LAB — CBC WITH DIFFERENTIAL/PLATELET
BAND NEUTROPHILS: 41 %
BAND NEUTROPHILS: 22 %
BASOS ABS: 0 10*3/uL (ref 0–0.1)
BASOS PCT: 0 %
BASOS PCT: 0 %
BASOS PCT: 0 %
Band Neutrophils: 32 %
Basophils Absolute: 0 10*3/uL (ref 0–0.1)
Basophils Absolute: 0 10*3/uL (ref 0–0.1)
Basophils Absolute: 0 10*3/uL (ref 0–0.1)
Basophils Relative: 0 %
Blasts: 0 %
Blasts: 0 %
Blasts: 0 %
EOS ABS: 0.1 10*3/uL (ref 0–0.7)
EOS ABS: 0 10*3/uL (ref 0–0.7)
EOS PCT: 0 %
EOS PCT: 3 %
EOS PCT: 4 %
Eosinophils Absolute: 0.1 10*3/uL (ref 0–0.7)
Eosinophils Absolute: 0 10*3/uL (ref 0–0.7)
Eosinophils Relative: 0 %
HCT: 32.1 % — ABNORMAL LOW (ref 35.0–47.0)
HCT: 39.3 % (ref 35.0–47.0)
HCT: 29.8 % — ABNORMAL LOW (ref 35.0–47.0)
HEMATOCRIT: 33.8 % — AB (ref 35.0–47.0)
HEMOGLOBIN: 10.8 g/dL — AB (ref 12.0–16.0)
Hemoglobin: 12.9 g/dL (ref 12.0–16.0)
Hemoglobin: 10.9 g/dL — ABNORMAL LOW (ref 12.0–16.0)
Hemoglobin: 9.8 g/dL — ABNORMAL LOW (ref 12.0–16.0)
LYMPHS ABS: 0.3 10*3/uL — AB (ref 1.0–3.6)
LYMPHS ABS: 0.5 10*3/uL — AB (ref 1.0–3.6)
LYMPHS PCT: 16 %
LYMPHS PCT: 6 %
Lymphocytes Relative: 12 %
Lymphocytes Relative: 16 %
Lymphs Abs: 0.9 10*3/uL — ABNORMAL LOW (ref 1.0–3.6)
Lymphs Abs: 2.5 10*3/uL (ref 1.0–3.6)
MCH: 28.3 pg (ref 26.0–34.0)
MCH: 28.7 pg (ref 26.0–34.0)
MCH: 28.8 pg (ref 26.0–34.0)
MCH: 29.3 pg (ref 26.0–34.0)
MCHC: 32.3 g/dL (ref 32.0–36.0)
MCHC: 32.8 g/dL (ref 32.0–36.0)
MCHC: 32.9 g/dL (ref 32.0–36.0)
MCHC: 33.7 g/dL (ref 32.0–36.0)
MCV: 87.1 fL (ref 80.0–100.0)
MCV: 87.5 fL (ref 80.0–100.0)
MCV: 87.5 fL (ref 80.0–100.0)
MCV: 87.6 fL (ref 80.0–100.0)
METAMYELOCYTES PCT: 9 %
MONO ABS: 0.2 10*3/uL (ref 0.2–0.9)
MONO ABS: 0.2 10*3/uL (ref 0.2–0.9)
MONO ABS: 0.6 10*3/uL (ref 0.2–0.9)
MYELOCYTES: 3 %
Metamyelocytes Relative: 4 %
Metamyelocytes Relative: 12 %
Monocytes Absolute: 0.5 10*3/uL (ref 0.2–0.9)
Monocytes Relative: 8 %
Monocytes Relative: 5 %
Monocytes Relative: 3 %
Monocytes Relative: 4 %
Myelocytes: 1 %
Myelocytes: 5 %
NEUTROS ABS: 14.2 10*3/uL — AB (ref 1.4–6.5)
NEUTROS PCT: 24 %
NEUTROS PCT: 35 %
NRBC: 1 /100{WBCs} — AB
Neutro Abs: 1.4 10*3/uL (ref 1.4–6.5)
Neutro Abs: 3.2 10*3/uL (ref 1.4–6.5)
Neutro Abs: 12.4 10*3/uL — ABNORMAL HIGH (ref 1.4–6.5)
Neutrophils Relative %: 91 %
Neutrophils Relative %: 44 %
OTHER: 0 %
OTHER: 0 %
Other: 0 %
PLATELETS: 396 10*3/uL (ref 150–440)
PLATELETS: 290 10*3/uL (ref 150–440)
PROMYELOCYTES RELATIVE: 0 %
PROMYELOCYTES RELATIVE: 0 %
Platelets: 307 10*3/uL (ref 150–440)
Platelets: 212 10*3/uL (ref 150–440)
Promyelocytes Relative: 0 %
RBC: 3.41 MIL/uL — ABNORMAL LOW (ref 3.80–5.20)
RBC: 3.69 MIL/uL — AB (ref 3.80–5.20)
RBC: 3.86 MIL/uL (ref 3.80–5.20)
RBC: 4.49 MIL/uL (ref 3.80–5.20)
RDW: 13.6 % (ref 11.5–14.5)
RDW: 13.8 % (ref 11.5–14.5)
RDW: 13.8 % (ref 11.5–14.5)
RDW: 13.9 % (ref 11.5–14.5)
WBC: 15.5 10*3/uL — ABNORMAL HIGH (ref 3.6–11.0)
WBC: 15.6 10*3/uL — AB (ref 3.6–11.0)
WBC: 2 10*3/uL — AB (ref 3.6–11.0)
WBC: 4 10*3/uL (ref 3.6–11.0)
nRBC: 3 /100 WBC — ABNORMAL HIGH
nRBC: 1 /100 WBC — ABNORMAL HIGH

## 2017-12-02 LAB — BLOOD GAS, ARTERIAL
ACID-BASE DEFICIT: 4.8 mmol/L — AB (ref 0.0–2.0)
ACID-BASE DEFICIT: 7.3 mmol/L — AB (ref 0.0–2.0)
Acid-base deficit: 9.7 mmol/L — ABNORMAL HIGH (ref 0.0–2.0)
BICARBONATE: 23 mmol/L (ref 20.0–28.0)
Bicarbonate: 19.7 mmol/L — ABNORMAL LOW (ref 20.0–28.0)
Bicarbonate: 18.3 mmol/L — ABNORMAL LOW (ref 20.0–28.0)
FIO2: 1
FIO2: 1
FIO2: 1
LHR: 16 {breaths}/min
MECHVT: 550 mL
Mechanical Rate: 16
O2 SAT: 81.1 %
O2 SAT: 85.1 %
O2 SAT: 94.2 %
PATIENT TEMPERATURE: 37
PCO2 ART: 45 mmHg (ref 32.0–48.0)
PCO2 ART: 55 mmHg — AB (ref 32.0–48.0)
PEEP/CPAP: 16 cmH2O
PEEP: 16 cmH2O
PEEP: 16 cmH2O
PO2 ART: 57 mmHg — AB (ref 83.0–108.0)
PO2 ART: 83 mmHg (ref 83.0–108.0)
Patient temperature: 37
Patient temperature: 37
RATE: 16 resp/min
VT: 550 mL
VT: 550 mL
pCO2 arterial: 48 mmHg (ref 32.0–48.0)
pH, Arterial: 7.25 — ABNORMAL LOW (ref 7.350–7.450)
pH, Arterial: 7.19 — CL (ref 7.350–7.450)
pH, Arterial: 7.23 — ABNORMAL LOW (ref 7.350–7.450)
pO2, Arterial: 60 mmHg — ABNORMAL LOW (ref 83.0–108.0)

## 2017-12-02 LAB — LACTIC ACID, PLASMA
LACTIC ACID, VENOUS: 2.9 mmol/L — AB (ref 0.5–1.9)
Lactic Acid, Venous: 3.4 mmol/L (ref 0.5–1.9)

## 2017-12-02 LAB — APTT: APTT: 30 s (ref 24–36)

## 2017-12-02 LAB — PHOSPHORUS
PHOSPHORUS: 5.1 mg/dL — AB (ref 2.5–4.6)
PHOSPHORUS: 4.5 mg/dL (ref 2.5–4.6)
Phosphorus: 3.7 mg/dL (ref 2.5–4.6)
Phosphorus: 3.7 mg/dL (ref 2.5–4.6)

## 2017-12-02 LAB — MRSA PCR SCREENING: MRSA by PCR: NEGATIVE

## 2017-12-02 LAB — PROTIME-INR
INR: 1.52
Prothrombin Time: 18.2 seconds — ABNORMAL HIGH (ref 11.4–15.2)

## 2017-12-02 LAB — GLUCOSE, CAPILLARY: GLUCOSE-CAPILLARY: 88 mg/dL (ref 70–99)

## 2017-12-02 MED ORDER — ALBUMIN HUMAN 25 % IV SOLN
50.0000 g | Freq: Four times a day (QID) | INTRAVENOUS | Status: AC
  Administered 2017-12-02 (×2): 50 g via INTRAVENOUS
  Filled 2017-12-02 (×5): qty 200

## 2017-12-02 MED ORDER — SODIUM CHLORIDE 0.9 % IV SOLN
0.0000 ug/min | INTRAVENOUS | Status: DC
  Administered 2017-12-02: 400 ug/min via INTRAVENOUS
  Administered 2017-12-02 (×3): 350 ug/min via INTRAVENOUS
  Administered 2017-12-02 (×2): 400 ug/min via INTRAVENOUS
  Administered 2017-12-02: 350 ug/min via INTRAVENOUS
  Administered 2017-12-02 (×3): 400 ug/min via INTRAVENOUS
  Administered 2017-12-03: 320 ug/min via INTRAVENOUS
  Administered 2017-12-03: 250 ug/min via INTRAVENOUS
  Administered 2017-12-03: 350 ug/min via INTRAVENOUS
  Administered 2017-12-03: 280 ug/min via INTRAVENOUS
  Administered 2017-12-03: 250 ug/min via INTRAVENOUS
  Administered 2017-12-03: 350 ug/min via INTRAVENOUS
  Filled 2017-12-02: qty 4
  Filled 2017-12-02: qty 40
  Filled 2017-12-02: qty 4
  Filled 2017-12-02: qty 40
  Filled 2017-12-02: qty 4
  Filled 2017-12-02 (×7): qty 40
  Filled 2017-12-02: qty 4
  Filled 2017-12-02 (×3): qty 40
  Filled 2017-12-02 (×2): qty 4
  Filled 2017-12-02 (×2): qty 40

## 2017-12-02 MED ORDER — NOREPINEPHRINE 16 MG/250ML-% IV SOLN
0.0000 ug/min | INTRAVENOUS | Status: DC
  Administered 2017-12-02: 5 ug/min via INTRAVENOUS
  Filled 2017-12-02 (×4): qty 250

## 2017-12-02 MED ORDER — SODIUM BICARBONATE 8.4 % IV SOLN
50.0000 meq | Freq: Once | INTRAVENOUS | Status: AC
  Administered 2017-12-02: 50 meq via INTRAVENOUS
  Filled 2017-12-02: qty 50

## 2017-12-02 MED ORDER — SODIUM CHLORIDE 0.9 % IV SOLN
2.0000 g | Freq: Once | INTRAVENOUS | Status: AC
  Administered 2017-12-02: 2 g via INTRAVENOUS
  Filled 2017-12-02: qty 20

## 2017-12-02 MED ORDER — FUROSEMIDE 10 MG/ML IJ SOLN
INTRAMUSCULAR | Status: AC
  Administered 2017-12-02: 20 mg via INTRAVENOUS
  Filled 2017-12-02: qty 2

## 2017-12-02 MED ORDER — BUDESONIDE 0.5 MG/2ML IN SUSP
0.5000 mg | Freq: Two times a day (BID) | RESPIRATORY_TRACT | Status: DC
  Administered 2017-12-02 – 2017-12-03 (×2): 0.5 mg via RESPIRATORY_TRACT
  Filled 2017-12-02: qty 2

## 2017-12-02 MED ORDER — SODIUM CHLORIDE 0.9 % IV BOLUS
1000.0000 mL | Freq: Once | INTRAVENOUS | Status: AC
  Administered 2017-12-02: 1000 mL via INTRAVENOUS

## 2017-12-02 MED ORDER — MIDAZOLAM HCL 2 MG/2ML IJ SOLN
2.0000 mg | INTRAMUSCULAR | Status: DC | PRN
  Administered 2017-12-03 (×3): 2 mg via INTRAVENOUS
  Filled 2017-12-02 (×3): qty 2

## 2017-12-02 MED ORDER — MIDAZOLAM HCL 2 MG/2ML IJ SOLN
2.0000 mg | Freq: Once | INTRAMUSCULAR | Status: AC
  Administered 2017-12-02: 2 mg via INTRAVENOUS

## 2017-12-02 MED ORDER — SODIUM CHLORIDE 0.9% FLUSH
10.0000 mL | Freq: Two times a day (BID) | INTRAVENOUS | Status: DC
  Administered 2017-12-02: 30 mL

## 2017-12-02 MED ORDER — SODIUM CHLORIDE 0.9% FLUSH: 10.0000 mL | INTRAVENOUS | Status: DC | PRN

## 2017-12-02 MED ORDER — NITROGLYCERIN 2 % TD OINT
0.5000 [in_us] | TOPICAL_OINTMENT | Freq: Four times a day (QID) | TRANSDERMAL | Status: DC
  Administered 2017-12-02 (×2): 0.5 [in_us] via TOPICAL
  Filled 2017-12-02 (×2): qty 1

## 2017-12-02 MED ORDER — MIDAZOLAM HCL 2 MG/2ML IJ SOLN: 2.0000 mg | INTRAMUSCULAR | Status: DC | PRN

## 2017-12-02 MED ORDER — ALBUTEROL SULFATE (2.5 MG/3ML) 0.083% IN NEBU: 2.5000 mg | INHALATION_SOLUTION | RESPIRATORY_TRACT | Status: DC | PRN

## 2017-12-02 MED ORDER — CHLORHEXIDINE GLUCONATE 0.12% ORAL RINSE (MEDLINE KIT)
15.0000 mL | Freq: Two times a day (BID) | OROMUCOSAL | Status: DC
  Administered 2017-12-02 – 2017-12-03 (×3): 15 mL via OROMUCOSAL

## 2017-12-02 MED ORDER — BUDESONIDE 0.25 MG/2ML IN SUSP: 0.2500 mg | Freq: Two times a day (BID) | RESPIRATORY_TRACT | Status: DC

## 2017-12-02 MED ORDER — NOREPINEPHRINE 4 MG/250ML-% IV SOLN
0.0000 ug/min | INTRAVENOUS | Status: DC
  Filled 2017-12-02: qty 250

## 2017-12-02 MED ORDER — VANCOMYCIN HCL IN DEXTROSE 1-5 GM/200ML-% IV SOLN
1000.0000 mg | INTRAVENOUS | Status: DC
  Administered 2017-12-03: 1000 mg via INTRAVENOUS
  Filled 2017-12-02: qty 200

## 2017-12-02 MED ORDER — HYDROCORTISONE NA SUCCINATE PF 100 MG IJ SOLR
50.0000 mg | Freq: Four times a day (QID) | INTRAMUSCULAR | Status: DC
  Administered 2017-12-02 – 2017-12-03 (×6): 50 mg via INTRAVENOUS
  Filled 2017-12-02 (×6): qty 2

## 2017-12-02 MED ORDER — VECURONIUM BROMIDE 10 MG IV SOLR
10.0000 mg | Freq: Once | INTRAVENOUS | Status: AC
  Administered 2017-12-02: 10 mg via INTRAVENOUS

## 2017-12-02 MED ORDER — ORAL CARE MOUTH RINSE
15.0000 mL | OROMUCOSAL | Status: DC
  Administered 2017-12-02 – 2017-12-03 (×11): 15 mL via OROMUCOSAL

## 2017-12-02 MED ORDER — FUROSEMIDE 10 MG/ML IJ SOLN
20.0000 mg | Freq: Once | INTRAMUSCULAR | Status: AC
  Administered 2017-12-02: 20 mg via INTRAVENOUS

## 2017-12-02 MED ORDER — MIDAZOLAM HCL 2 MG/2ML IJ SOLN
2.0000 mg | Freq: Once | INTRAMUSCULAR | Status: AC
  Administered 2017-12-02: 2 mg via INTRAVENOUS
  Filled 2017-12-02: qty 2

## 2017-12-02 MED ORDER — AMIODARONE HCL IN DEXTROSE 360-4.14 MG/200ML-% IV SOLN
INTRAVENOUS | Status: AC
  Filled 2017-12-02: qty 200

## 2017-12-02 MED ORDER — STERILE WATER FOR INJECTION IJ SOLN
INTRAMUSCULAR | Status: AC
  Administered 2017-12-03: 01:00:00
  Filled 2017-12-02: qty 10

## 2017-12-02 MED ORDER — SODIUM BICARBONATE 8.4 % IV SOLN
100.0000 meq | Freq: Once | INTRAVENOUS | Status: AC
  Administered 2017-12-02: 100 meq via INTRAVENOUS
  Filled 2017-12-02: qty 100

## 2017-12-02 MED ORDER — IPRATROPIUM-ALBUTEROL 0.5-2.5 (3) MG/3ML IN SOLN
3.0000 mL | Freq: Four times a day (QID) | RESPIRATORY_TRACT | Status: DC
  Administered 2017-12-02 – 2017-12-03 (×4): 3 mL via RESPIRATORY_TRACT
  Filled 2017-12-02 (×3): qty 3

## 2017-12-02 MED ORDER — SODIUM CHLORIDE 0.9 % IV SOLN
INTRAVENOUS | Status: DC | PRN
  Administered 2017-12-02: 250 mL via INTRAVENOUS

## 2017-12-02 MED ORDER — VASOPRESSIN 20 UNIT/ML IV SOLN
0.0400 [IU]/min | INTRAVENOUS | Status: DC
  Administered 2017-12-02: 0.03 [IU]/min via INTRAVENOUS
  Administered 2017-12-02: 0.04 [IU]/min via INTRAVENOUS
  Filled 2017-12-02 (×4): qty 2

## 2017-12-02 MED ORDER — VECURONIUM BROMIDE 10 MG IV SOLR
INTRAVENOUS | Status: AC
  Administered 2017-12-02: 10 mg via INTRAVENOUS
  Filled 2017-12-02: qty 10

## 2017-12-02 MED ORDER — EPINEPHRINE PF 1 MG/ML IJ SOLN
0.5000 ug/min | INTRAVENOUS | Status: DC
  Administered 2017-12-02: 0.5 ug/min via INTRAVENOUS
  Filled 2017-12-02: qty 4

## 2017-12-02 MED ORDER — STERILE WATER FOR INJECTION IV SOLN
INTRAVENOUS | Status: DC
  Administered 2017-12-02 – 2017-12-03 (×2): via INTRAVENOUS
  Filled 2017-12-02 (×4): qty 850

## 2017-12-02 NOTE — Progress Notes (Signed)
Critical lactic acid result of  3.4 called from lab.

## 2017-12-02 NOTE — Procedures (Signed)
Central Venous Catheter Insertion Procedure Note Deanna Haynes [id number] 1965/09/26  Procedure: Insertion of Central Venous Catheter Indications: Drug and/or fluid administration  Procedure Details Consent: Risks of procedure as well as the alternatives and risks of each were explained to the (patient/caregiver).  Consent for procedure obtained. Time Out: Verified patient identification, verified procedure, site/side was marked, verified correct patient position, special equipment/implants available, medications/allergies/relevent history reviewed, required imaging and test results available.  Performed  Maximum sterile technique was used including antiseptics, cap, gloves, gown, hand hygiene, mask and sheet. Skin prep: Chlorhexidine; local anesthetic administered A antimicrobial bonded/coated triple lumen catheter was placed in the right femoral vein due to emergent situation and patient can't tolerate Trenedelenburg postion using the Seldinger technique.  Evaluation Rt. Femoral cental venous line was successfully placed.  Blood flow good Complications: No apparent complications Patient did tolerate procedure well. Chest X-ray ordered to verify placement.  CXR: N/A Rt/ femoral TLC.Deanna Haynes Deanna Haynes 12/02/2017, 12:48 PM

## 2017-12-02 NOTE — Progress Notes (Signed)
E-Link notified of critical lab values

## 2017-12-02 NOTE — Progress Notes (Signed)
Endotracheal Intubation Procedure Note  Indication for endotracheal intubation: respiratory failure. Airway Assessment: Mallampati Class: II (hard and soft palate, upper portion of tonsils anduvula visible). Sedation: etomidate. Paralytic: none. Lidocaine: no. Atropine: no. Equipment: Glide scope, 8 Fr ETT. Cricoid Pressure: no. Number of attempts: 1. ETT location confirmed by by auscultation, by CXR and ETCO2 monitor.  Tery Hoeger 12/02/2017 Vomiting and aspiration during intubation.  O2 sat remained more than 88% 5 L of gastric output was suctioned after placement of OGT.

## 2017-12-02 NOTE — Progress Notes (Signed)
Patient using accessory muscles to breath on ventilator. O2 sats in mid to upper 80s.  Patient BP 80s/30s with MAPs in 50s starting at 9am per arterial line. ICU MD made new orders including stopping sedation. See MAR for orders.    BP improvements to 140s/40s with MAPs 65-70 per arterial line. Stopped Epi drip at 1511. As per instructions from ICU MD to titrate Epi, Neo, Vaso and Levo drips down. Roselyn Reef RN witnessed instructions.   ICU MD insistent about placing Nitro paste on patient's hands and feet due to mottling and weak to absent pedal pulses. Vascular surgeon advised against it.Held 1200 dose due to BP in 80s/40s with MAP of 50s per arterial line.   Will continue to closely monitor.

## 2017-12-02 NOTE — Progress Notes (Addendum)
[place] [facility] [place] NAME: Deanna Haynes    MR#:  [id number]  DATE OF BIRTH:  [birth year]  SUBJECTIVE:  CHIEF COMPLAINT:   Chief Complaint  Patient presents with  . Hypotension  . Weakness   The patient was intubated and on vent due to worsening respiratory failure. Hypotension, on vasopressor drips. REVIEW OF SYSTEMS:  Review of Systems  Unable to perform ROS: Intubated    DRUG ALLERGIES:   Allergies  Allergen Reactions  . Codeine Nausea And Vomiting   VITALS:  Blood pressure (!) 45/32, pulse 100, temperature 98.8 F (37.1 C), temperature source Oral, resp. rate 17, height 5\' 4"  (1.626 m), weight 78.5 kg, SpO2 (!) 85 %. PHYSICAL EXAMINATION:  Physical Exam  Constitutional: She appears well-developed.  HENT:  Head: Normocephalic.  Eyes: Pupils are equal, round, and reactive to light. Conjunctivae are normal. No scleral icterus.  Neck: Neck supple. No JVD present. No tracheal deviation present.  Cardiovascular: Normal rate, regular rhythm and normal heart sounds. Exam reveals no gallop.  No murmur heard. Pulmonary/Chest: No respiratory distress. She has wheezes. She has no rales.  Bilateral crackles.  Abdominal: Soft. Bowel sounds are normal. She exhibits distension. There is no tenderness. There is no rebound.  Musculoskeletal: She exhibits no edema.  Neurological:  Intubated, on vent.  Skin: No rash noted. No erythema.   LABORATORY PANEL:  Female CBC Recent Labs  Lab 12/02/17 0331  WBC 4.0  HGB 12.9  HCT 39.3  PLT 396   ------------------------------------------------------------------------------------------------------------------ Chemistries  Recent Labs  Lab 12/02/17 0331  NA 138  K 3.7  CL 106  CO2 23  GLUCOSE 102*  BUN 46*  CREATININE 1.84*  CALCIUM 6.8*  MG 2.1  AST 69*  ALT 23  ALKPHOS 37*  BILITOT 0.7   RADIOLOGY:  Dg Chest 1 View  Result Date: 12/01/2017 CLINICAL DATA:  52 year old  female with history of hypoxia. Shortness of breath. History of cervical cancer. Former smoker. EXAM: CHEST  1 VIEW COMPARISON:  Chest x-ray 11/01/2017. FINDINGS: Low lung volumes. Patchy ill-defined opacities in lung bases bilaterally may reflect atelectasis and/or airspace consolidation. Small bilateral pleural effusions. No evidence of pulmonary edema. Heart size is normal. Upper mediastinal contours are within normal limits. IMPRESSION: 1. Low lung volumes with bibasilar areas of atelectasis and/or consolidation and small bilateral pleural effusions. Electronically Signed   By: Vinnie Langton M.D.   On: 12/01/2017 19:51   Ct Abdomen Pelvis W Contrast  Result Date: 12/01/2017 CLINICAL DATA:  52 year old female with concern for bowel obstruction. Status post intubation and NGT placement. History of cervical cancer. EXAM: CT ABDOMEN AND PELVIS WITH CONTRAST TECHNIQUE: Multidetector CT imaging of the abdomen and pelvis was performed using the standard protocol following bolus administration of intravenous contrast. CONTRAST:  140mL ISOVUE-300 IOPAMIDOL (ISOVUE-300) INJECTION 61% COMPARISON:  Abdominal radiograph dated 12/01/2017 and CT dated 08/01/2015 FINDINGS: Lower chest: Small bilateral pleural effusions. Partial bilateral lower lobe consolidation with air bronchogram may represent atelectasis versus infiltrate. Patchy areas of airspace ground-glass density noted throughout the visualized lungs concerning for multifocal pneumonia. Clinical correlation is recommended. There is no intra-abdominal free air. Small free fluid within the pelvis. Hepatobiliary: No focal liver abnormality is seen. No gallstones, gallbladder wall thickening, or biliary dilatation. Pancreas: There is a 7 mm hypodense lesion with a recently rim of calcification in the body of the pancreas which is similar to the study of 2017. The stability over the course of 2  years represents a benign or indolent process. No dilatation of the main  pancreatic duct or gland atrophy. No active inflammatory changes. Spleen: Normal in size without focal abnormality. Adrenals/Urinary Tract: Indeterminate 10 mm left adrenal nodule. The right adrenal gland is unremarkable. There is no hydronephrosis on either side. Subcentimeter right renal hypodense lesion is too small to characterize wall most consistent with a cyst. The visualized ureters are unremarkable. The urinary bladder is decompressed around a Foley catheter. Stomach/Bowel: There is dilatation of the fluid-filled loops of small bowel throughout the abdomen measuring up to 5.2 cm in caliber. There is a focal area of transition in the right hemipelvis (series 2, image 83 and coronal series 5, image 55) likely secondary to adhesions. An enteric tube is seen with tip and side-port in the gastric fundus. The stomach is moderately distended with liquid content. There is small amount of fluid in the distal esophagus. The appendix is normal. Vascular/Lymphatic: There is mild aortoiliac atherosclerotic disease. A right femoral approach venous line with tip in the right common iliac vein. No portal venous gas. There is no adenopathy. Reproductive: Hysterectomy.  No pelvic mass. Other: Mild diffuse subcutaneous edema. Musculoskeletal: No acute or significant osseous findings. IMPRESSION: 1. High-grade small bowel obstruction with transition in the right hemipelvis likely secondary to adhesions. 2. Small bilateral pleural effusions and bibasilar consolidative changes. Patchy bilateral airspace densities concerning for multifocal pneumonia. Clinical correlation is recommended. The 3. A subcentimeter hypodense lesion in the body of the pancreas appears similar to prior CT. Electronically Signed   By: Anner Crete M.D.   On: 12/01/2017 22:39   Dg Chest Port 1 View  Result Date: 12/02/2017 CLINICAL DATA:  Pneumonia EXAM: PORTABLE CHEST 1 VIEW COMPARISON:  12/01/2017 FINDINGS: Endotracheal tube is in place, tip 5.8  centimeters above the carina. A nasogastric tube is in place, tip overlying the level of the stomach. Heart size is normal. There patchy airspace filling opacities bilaterally, partially obscuring the hemidiaphragms and stable in appearance. IMPRESSION: Persistent bilateral airspace filling opacities. Electronically Signed   By: Nolon Nations M.D.   On: 12/02/2017 08:39   Dg Chest Port 1 View  Result Date: 12/01/2017 CLINICAL DATA:  Acute respiratory failure. EXAM: PORTABLE CHEST 1 VIEW COMPARISON:  Chest radiograph December 01, 2017 at 2030 hours FINDINGS: Endotracheal tube tip projects 4.3 cm above the carina. Nasogastric tube tip projects in proximal stomach. Market interval worsening of interstitial densities mid and lower lung zone. Bibasilar consolidation with air bronchograms. Potential small pleural effusions. Cardiomediastinal silhouette is normal. No pneumothorax. Soft tissue planes and included osseous structures are unchanged. IMPRESSION: 1. Endotracheal tube tip projects 4.3 cm above the carina. Nasogastric tube tip projects in proximal stomach. 2. Marked worsening of interstitial and alveolar airspace opacities. Small potential pleural effusions. Electronically Signed   By: Elon Alas M.D.   On: 12/01/2017 23:51   Dg Chest Port 1 View  Result Date: 12/01/2017 CLINICAL DATA:  52 year old female status post intubation NG tube placement. EXAM: PORTABLE CHEST 1 VIEW COMPARISON:  CT of the abdomen pelvis dated 08/01/2015 FINDINGS: An endotracheal tube is noted with tip approximately 4.5 cm above the carina. An enteric tube extends below the diaphragm with side port and tip in the left upper abdomen likely in the proximal stomach. There is flattening of the left hemidiaphragm and blunting the left costophrenic angle which may be related to chronic changes and scarring or a small pleural effusion. Minimal bilateral perihilar and infrahilar streaky densities, likely atelectatic  changes. There  is no pneumothorax. Dilated small bowel loops in the left in the abdomen measures 5.5 cm in diameter. There is no free air or radiopaque calculi. The osseous structures and soft tissues are grossly unremarkable. IMPRESSION: 1. Endotracheal tube above the carina and enteric tube in the proximal stomach. 2. Probable small left pleural effusion. 3. Dilated small bowel loops concerning for obstruction versus ileus. Electronically Signed   By: Anner Crete M.D.   On: 12/01/2017 21:05   Dg Abd Portable 1v  Result Date: 12/01/2017 CLINICAL DATA:  52 year old female status post intubation NG tube placement. EXAM: PORTABLE CHEST 1 VIEW COMPARISON:  CT of the abdomen pelvis dated 08/01/2015 FINDINGS: An endotracheal tube is noted with tip approximately 4.5 cm above the carina. An enteric tube extends below the diaphragm with side port and tip in the left upper abdomen likely in the proximal stomach. There is flattening of the left hemidiaphragm and blunting the left costophrenic angle which may be related to chronic changes and scarring or a small pleural effusion. Minimal bilateral perihilar and infrahilar streaky densities, likely atelectatic changes. There is no pneumothorax. Dilated small bowel loops in the left in the abdomen measures 5.5 cm in diameter. There is no free air or radiopaque calculi. The osseous structures and soft tissues are grossly unremarkable. IMPRESSION: 1. Endotracheal tube above the carina and enteric tube in the proximal stomach. 2. Probable small left pleural effusion. 3. Dilated small bowel loops concerning for obstruction versus ileus. Electronically Signed   By: Anner Crete M.D.   On: 12/01/2017 21:05   ASSESSMENT AND PLAN:   1.  Sepsis (Hypotension, tachycardia, leukocytosis) due to HCP/aspiration pneumonia and UTI Continue Zosyn and vancomycin, nebulizer, Follow-up CBC and cultures.  2.    Acute respiratory failure with hypoxia due to HAP/aspiration pneumonia She was  intubated, continue ventilation.  * Hypotension due to above. Continue vasopressors and IVF.  3.  ARF, likely prerenal, secondary to above.   Continue IV fluids and follow-up BMP.  Avoid nephrotoxic medications.  * lactic acidosis, due to above. Follow lactic acid.  4.  Alcohol abuse.    On CIWA alcohol, which includes thiamine replacement.  5.  Hyportension.   continue IV fluids.   6.  Depression and anxiety.  * High-grade small bowel obstruction. NPO, NGT suction, IVF support, follow up surgery consult.  Discussed with intensivisty, Dr. Earnest Bailey. All the records are reviewed and case discussed with Care Management/Social Worker. Management plans discussed with the patient, her sister POA and they are in agreement.  CODE STATUS: Full Code  TOTAL TIME TAKING CARE OF THIS PATIENT: 38 minutes.   More than 50% of the time was spent in counseling/coordination of care: YES  POSSIBLE D/C IN ? DAYS, DEPENDING ON CLINICAL CONDITION.   Demetrios Loll M.D on 12/02/2017 at 11:49 AM  Between 7am to 6pm - Pager - [phone]  After 6pm go to [email or web address] - [facility]

## 2017-12-02 NOTE — Progress Notes (Signed)
Dr. Soyla Murphy at patients bedside.

## 2017-12-02 NOTE — Progress Notes (Signed)
While reviewing labs it was noted that patients hemoglobin has steadily been decreasing; on 12/02/17 @ 0330 Hgb 12.9, @1330  10.9, and @1845  9.8. NP notified and new orders have been placed for Q6 hemoglobins starting now. Will continue to monitor.

## 2017-12-02 NOTE — Consult Note (Signed)
Pharmacy Antibiotic Note  Deanna Haynes is a 52 y.o. female admitted on 11/29/2017 with pneumonia/HCAP.  Pharmacy has been consulted for Vancomycin and Zosyn dosing.  Plan: Ke: 0.054   T1/2: 12.9   Vd: 54.6     Vancomycin 1g IV x 1 dose, followed by Vancomycin 1250 IV every 18 hours with 6 hour stack dosing.  Goal trough 15-20 mcg/mL. Calculated trough at Css is 15.3. Trough level prior to 4 dose. MRSA PCR ordered. If negative, recommend discontinuation of vancomycin.  Start Zosyn 3.375 EI IV every 8 hours.   9/2- MD would like to continue Vancomycin at this time.  Scr has increased from 1.37 to 1.84.  Will adjust Vancomycin to 1 gram IV q24h.  Ke 0.029  t 1/2 23.9  Vd 44.9  AdjBW 64.2 kg Will check level prior to 4th dose of new regimen (5th day of therapy)    Height: 5\' 4"  (162.6 cm) Weight: 173 lb 1 oz (78.5 kg) IBW/kg (Calculated) : 54.7  Temp (24hrs), Avg:99.2 F (37.3 C), Min:98.3 F (36.8 C), Max:100.3 F (37.9 C)  Recent Labs  Lab 11/29/17 0424 11/22/2017 2048 12/01/17 0425 12/01/17 1417 12/01/17 1618 12/02/17 0055 12/02/17 0057 12/02/17 0330 12/02/17 0331 12/02/17 1333  WBC  --  14.6* 13.8*  --   --  2.0*  --   --  4.0 15.6*  CREATININE 0.49 1.13* 1.07*  --   --  1.37*  --   --  1.84*  --   LATICACIDVEN  --   --   --  1.1 1.2  --  2.9* 3.4*  --   --     Estimated Creatinine Clearance: 35.4 mL/min (A) (by C-G formula based on SCr of 1.84 mg/dL (H)).    Allergies  Allergen Reactions  . Codeine Nausea And Vomiting    Antimicrobials this admission: 9/1 Zosyn >>  9/1 Vancomycin  >>   Microbiology results: 9/1  MRSA PCR: pending   Thank you for allowing pharmacy to be a part of this patient's care.  Noralee Space, PharmD, BCPS Clinical Pharmacist 12/02/2017 2:52 PM

## 2017-12-02 NOTE — Progress Notes (Signed)
Arterial Catheter Insertion Procedure Note Laniece Hornbaker [id number] [birth year]  Procedure: Insertion of Arterial Catheter  Indications: Blood pressure monitoring  Procedure Details Consent: Risks of procedure as well as the alternatives and risks of each were explained to the (patient/caregiver).  Consent for procedure obtained. Time Out: Verified patient identification, verified procedure, site/side was marked, verified correct patient position, special equipment/implants available, medications/allergies/relevent history reviewed, required imaging and test results available.  Performed  Maximum sterile technique was used including antiseptics, cap, gloves, gown, hand hygiene, mask and sheet. Skin prep: Chlorhexidine; local anesthetic administered 20 gauge catheter was inserted into right femoral artery using the Seldinger technique. ULTRASOUND GUIDANCE USED: NO Evaluation Blood flow good; BP tracing good. Complications: No apparent complications.   Sherena Machorro 12/02/2017

## 2017-12-02 NOTE — Progress Notes (Signed)
Patient intubated with 7.5 ETtube 23 at the lip and on  PRVC.  Abd distended and taut, OG tube placed and connected to LWIS removed 07-998 ml canisters of brown bile. Abd CT ordered.

## 2017-12-02 NOTE — Progress Notes (Signed)
[facility] RN notified of critical Lactic acid value 3.4. Acknowledged, no new orders at this time.

## 2017-12-02 NOTE — Consult Note (Addendum)
SURGICAL CONSULTATION NOTE (initial) - cpt: 99255  HISTORY OF PRESENT ILLNESS (HPI):  52 y.o. critically ill female, currently intubated and sedated on multiple vasopressors, presented to [facility] ED 8/28 for evaluation of nausea and vomiting with generalized weakness x 1 week. On the day prior to her presentation at that time, she'd also presented to her primary care NP and was prescribed phenergan, which she could not tolerate. Patient is unable to speak due to intubation with sedation, and therefor her history is obtained from two "close friends" at bedside, one of which identifies as her sister/friend and healthcare POA, patient's RN, and her medical record. It seems patient was admitted at that time for treatment of alcoholic ketoacidosis with dehydration, at which time she reportedly denied any abdominal pain. Her family now says she may have had abdominal distention at that time, but describe that it was "not as bad" and the timing over which it developed is uncertain. Two days following admission, patient is documented as saying "all she wants to do is eat", which it seems she tolerated, and she was discharged home. Patient, however, returned to [facility] ED the next day (8/31) with N/V, tachycardia, hypotension, and "borderline hypoxia". Pneumonia and UTI with AKI were diagnosed, and patient was re-admitted. Yesterday, patient was transferred to ICU for worsened anxiety and hypoxia despite Ativan and increased supplemental O2, for which she was intubated and OG tube was placed. Upon placement of OG tube, 5 Liters of fluid were drained, which seems to have prompted CT abdomen, along with concern for aspiration pneumonia.  Patient's friends at bedside deny any known history of SBO and state patient has not eaten well for nearly 2 weeks with intermittent diarrhea. They also acknowledge patient drinks a "large bottle of wine" every "couple of days" and state she "looked pregnant" when she re-presented to [facility] ED,  which was worst yesterday and has improved significantly since OG tube was inserted.  Surgery is consulted by ICU physician Dr. Soyla Murphy in this context for evaluation and management of SBO.  PAST MEDICAL HISTORY (PMH):  Past Medical History:  Diagnosis Date  . Anxiety   . Cervical cancer [facility])    Radical Hysterectomy 11/26/14 with adjuvant chemoradiation  . Vaginal cuff dehiscence      PAST SURGICAL HISTORY ([place]):  Past Surgical History:  Procedure Laterality Date  . ABDOMINAL HYSTERECTOMY    . RADICAL HYSTERECTOMY  11/26/2014   Stage IB2 Cervical Cancer, adjuvant chemoradiation  . REPAIR VAGINAL CUFF       MEDICATIONS:  Prior to Admission medications   Medication Sig Start Date End Date Taking? Authorizing Provider  alum & mag hydroxide-simeth (MAALOX/MYLANTA) 200-200-20 MG/5ML suspension Take 30 mLs by mouth every 4 (four) hours as needed for indigestion or heartburn. 11/28/17  Yes Demetrios Loll, MD  clonazePAM (KLONOPIN) 0.5 MG tablet Take 0.5 mg by mouth daily.   Yes [provider]  ferrous sulfate 325 (65 FE) MG tablet Take 325 mg by mouth daily with breakfast.   Yes [provider]  metoprolol tartrate (LOPRESSOR) 25 MG tablet Take 1 tablet (25 mg total) by mouth 2 (two) times daily. 11/29/17  Yes Demetrios Loll, MD  Multiple Vitamins-Minerals (MULTIVITAMIN WITH MINERALS) tablet Take 1 tablet by mouth daily.   Yes [provider]  pantoprazole (PROTONIX) 40 MG tablet Take 1 tablet (40 mg total) by mouth 2 (two) times daily before a meal. 11/28/17  Yes Demetrios Loll, MD  promethazine (PHENERGAN) 25 MG tablet Take 25 mg by  mouth every 6 (six) hours as needed for nausea/vomiting. 11/26/17  Yes [provider]  rOPINIRole (REQUIP) 0.5 MG tablet Take 0.5 mg by mouth at bedtime. 11/14/17  Yes [provider]  sertraline (ZOLOFT) 100 MG tablet Take 100 mg by mouth at bedtime. 11/14/17  Yes [provider]  traZODone (DESYREL) 50 MG tablet Take 50  mg by mouth at bedtime.   Yes [provider]  vitamin B-12 (CYANOCOBALAMIN) 100 MCG tablet Take 100 mcg by mouth daily.   Yes [provider]     ALLERGIES:  Allergies  Allergen Reactions  . Codeine Nausea And Vomiting     SOCIAL HISTORY:  Social History   Socioeconomic History  . Marital status: Single    Spouse name: Not on file  . Number of children: Not on file  . Years of education: Not on file  . Highest education level: Not on file  Occupational History  . Occupation: Quarry manager  Social Needs  . Financial resource strain: Not on file  . Food insecurity:    Worry: Not on file    Inability: Not on file  . Transportation needs:    Medical: Not on file    Non-medical: Not on file  Tobacco Use  . Smoking status: Former Smoker    Packs/day: 1.00    Years: 20.00    Pack years: 20.00    Types: Cigarettes    Last attempt to quit: 11/01/2014    Years since quitting: 3.0  . Smokeless tobacco: Never Used  Substance and Sexual Activity  . Alcohol use: Yes    Comment:  3 bottles a wine a week. Last drank approx. 11/16/17  . Drug use: No  . Sexual activity: Not on file  Lifestyle  . Physical activity:    Days per week: Not on file    Minutes per session: Not on file  . Stress: Not on file  Relationships  . Social connections:    Talks on phone: Not on file    Gets together: Not on file    Attends religious service: Not on file    Active member of club or organization: Not on file    Attends meetings of clubs or organizations: Not on file    Relationship status: Not on file  . Intimate partner violence:    Fear of current or ex partner: Not on file    Emotionally abused: Not on file    Physically abused: Not on file    Forced sexual activity: Not on file  Other Topics Concern  . Not on file  Social History Narrative  . Not on file    The patient currently resides (home / rehab facility / nursing home): Home The patient normally is (ambulatory /  bedbound): Ambulatory   FAMILY HISTORY:  Family History  Problem Relation Age of Onset  . Anxiety disorder Mother   . Cervical cancer Mother      REVIEW OF SYSTEMS: Limited to HPI due to intubation with sedation  VITAL SIGNS:  Temp:  [98.3 F (36.8 C)-100.3 F (37.9 C)] 98.4 F (36.9 C) (09/02 1600) Pulse Rate:  [91-126] 100 (09/02 1500) Resp:  [12-34] 14 (09/02 1500) BP: (45-165)/(19-140) 111/62 (09/02 1500) SpO2:  [72 %-100 %] 92 % (09/02 1500) Arterial Line BP: (82-153)/(36-67) 147/45 (09/02 1500) FiO2 (%):  [100 %] 100 % (09/02 1430)     Height: 5\' 4"  (162.6 cm) Weight: 78.5 kg BMI (Calculated): 29.69   INTAKE/OUTPUT:  This shift: Total I/O In: 5780.2 [I.V.:3110; IV Piggyback:2670.2] Out: 450 [Urine:450]  Last 2 shifts: @IOLAST2SHIFTS @   PHYSICAL EXAM:  Constitutional:  -- Normal body habitus  -- Sedated, no apparent distress Eyes:  -- No scleral icterus -- B/L no occular discharge Ear, nose, throat: -- No jugular venous distension  -- No carotid bruits appreciated  Pulmonary:  -- Intubated -- No wheezes or rhales -- Equally decreased breath sounds bilaterally -- Breathing non-labored at rest Cardiovascular:  -- S1, S2 present  -- No pericardial rubs  Gastrointestinal:  -- Abdomen soft and mild-/moderate-ly distended, no guarding or rebound tenderness (though exam unreliable due to sedation) -- No abdominal masses appreciated, pulsatile or otherwise  Musculoskeletal and Integumentary:  -- Wounds or skin discoloration: None appreciated -- Extremities: B/L UE and LE FROM, hands and feet pale and cool  Neurologic:  -- Motor function: Unable to assess due to intubation with sedation -- Sensation: Unable to assess due to intubation with sedation Psychiatric:  -- Unable to assess due to intubation with sedation  Labs:  CBC Latest Ref Rng & Units 12/02/2017 12/02/2017 12/02/2017  WBC 3.6 - 11.0 K/uL 15.6(H) 4.0 2.0(L)  Hemoglobin 12.0 - 16.0 g/dL 10.9(L) 12.9  10.8(L)  Hematocrit 35.0 - 47.0 % 33.8(L) 39.3 32.1(L)  Platelets 150 - 440 K/uL 290 396 307   CMP Latest Ref Rng & Units 12/02/2017 12/02/2017 12/01/2017  Glucose 70 - 99 mg/dL 102(H) 88 120(H)  BUN 6 - 20 mg/dL 46(H) 40(H) 37(H)  Creatinine 0.44 - 1.00 mg/dL 1.84(H) 1.37(H) 1.07(H)  Sodium 135 - 145 mmol/L 138 138 139  Potassium 3.5 - 5.1 mmol/L 3.7 3.0(L) 3.5  Chloride 98 - 111 mmol/L 106 113(H) 102  CO2 22 - 32 mmol/L 23 20(L) 25  Calcium 8.9 - 10.3 mg/dL 6.8(L) 5.7(LL) 8.4(L)  Total Protein 6.5 - 8.1 g/dL 4.1(L) 3.2(L) -  Total Bilirubin 0.3 - 1.2 mg/dL 0.7 0.5 -  Alkaline Phos 38 - 126 U/L 37(L) 31(L) -  AST 15 - 41 U/L 69(H) 48(H) -  ALT 0 - 44 U/L 23 16 -   ABG    Component Value Date/Time   PHART 7.19 (LL) 12/02/2017 1320   PCO2ART 48 12/02/2017 1320   PO2ART 57 (L) 12/02/2017 1320   HCO3 18.3 (L) 12/02/2017 1320   ACIDBASEDEF 9.7 (H) 12/02/2017 1320   O2SAT 81.1 12/02/2017 1320   Lactate (12/02/2017, 3:30 am): 3.2  Imaging studies:  CT Abdomen and Pelvis with Contrast (12/01/2017) - personally reviewed and discussed with patient's "close friends" and RN 1. High-grade small bowel obstruction with transition in the right hemipelvis likely secondary to adhesions. 2. Small bilateral pleural effusions and bibasilar consolidative changes. Patchy bilateral airspace densities concerning for multifocal pneumonia. Clinical correlation is recommended. The 3. A subcentimeter hypodense lesion in the body of the pancreas appears similar to prior CT.  Chest X-ray (12/01/2017) 1. Endotracheal tube tip projects 4.3 cm above the carina. Nasogastric tube tip projects in proximal stomach. 2. Marked worsening of interstitial and alveolar airspace opacities. Small potential pleural effusions.  Assessment/Plan: (ICD-10's: K54.52) 52 y.o. female with small bowel obstruction, likely attributable to post-surgical adhesions following hysterectomy (2016) and subsequent vaginal cuff leak and pelvic  radiation, currently complicated by criticall systemic illness with severe septic shock despite multiple high-dose vasopressors, neutropenia until steroids administered, and by pertinent comorbidities including malnutrition, chronic alcohol dependence and abuse, former tobacco abuse (smoking), generalized anxiety disorder, major depression disorder, and a history of cervical cancer.  - NPO,  IV fluids, correct electrolytes             - OG/NG tube for nasogastric decompression             - keep head of bed >30 degrees, monitor ongoing bowel function and abdominal exam              - under normal circumstances, anticipate symptomatic relief within 24 - 48 hours following NGT insertion, followed by "rumbling" the following day and flatus either the same day or the day following the "rumbling" with anticipated length of stay ~3 - 5 days with successful non-operative management for 8 of 10 patients with small bowel obstruction attributed to post-surgical adhesions, but patient is currently critically ill (likely attributable to her aspiration pneumonia) with guarded prognosis  - surgical intervention if doesn't improve was also discussed, but considering current circumstances, patient is not currently suitable for any non-emergent surgery even if were to become otherwise indicated             - supportive care and medical management as per ICU and medical team             - DVT prophylaxis, wean vasopressors  All of the above findings and recommendations were discussed with the patient's friends and ICU RN, and all of patient's friends' questions were answered to their expressed satisfaction.  Thank you for the opportunity to participate in this patient's care.   -- Marilynne Drivers Rosana Hoes, MD, [place]: [facility] - Partnering for exceptional care. Office: [phone]

## 2017-12-02 NOTE — Significant Event (Signed)
Rapid Response Event Note  Overview: Time Called: 4403 Arrival Time: 4742 Event Type: Respiratory  Initial Focused Assessment: Patient responsive to voice. Clearly in respiratory distress, see assessment flowsheet. Bedside RN stated MD Salary had been notified of patient event, this RN had bedside RN page MD Salary again to notify patient needs to be transferred to CCU. Patient with O2 sats in low 90's on 8L [place], audible inspiratory, expiratory wheezing and rhonchi all lung fields bilaterally. Patient became more lethargic and oxygen saturations dropped to upper 80's. Patient transferred to CCU per MD Salary orders.   Interventions: ABG had been attempted without success. CXR had been ordered. Before these were completed, patient moved to CCU.  Plan of Care (if not transferred):  Event Summary: Name of Physician Notified: Salary, MD at 1845    at    Outcome: Transferred (Comment)(transfer to [place])  Event End Time: [place]

## 2017-12-02 NOTE — Progress Notes (Signed)
   12/02/17 0500  Clinical Encounter Type  Visited With Patient;Family  Visit Type Follow-up;Spiritual support (Assurance for sister Velva Harman.)  Recommendations Follow-up as needed.  Spiritual Encounters  Spiritual Needs Prayer;Emotional  Stress Factors  Patient Stress Factors Health changes  Family Stress Factors Health changes   During morning prayers, Chaplain engaged the patient's sister, Velva Harman, and the patient's nurse and received a summary of the patient's progress. Chaplain offered continued emotional support and prayers for healing.

## 2017-12-02 NOTE — Progress Notes (Signed)
Nutrition Follow-up  DOCUMENTATION CODES:   Not applicable  INTERVENTION:  Patient is not currently appropriate for enteral nutrition. Will continue to monitor and make recommendations for enteral nutrition once appropriate. As patient does not meet criteria for malnutrition, recommend holding off on initiation of TPN until 9/7 if patient is unable to tolerate enteral nutrition by then.  Patient will be at risk for refeeding syndrome with initiation of nutrition support or any dextrose-containing fluid in setting of EtOH abuse. Once either of these are initiated, monitor potassium, magnesium, and phosphorus daily for at least 3 days and supplement as needed.  NUTRITION DIAGNOSIS:   Inadequate oral intake related to inability to eat as evidenced by NPO status.  New nutrition diagnosis.  GOAL:   Provide needs based on ASPEN/SCCM guidelines  Not progressing at this time.  MONITOR:   Vent status, Labs, Weight trends, Skin, I & O's  REASON FOR ASSESSMENT:   Malnutrition Screening Tool, Ventilator    ASSESSMENT:   52 y.o. female with a known history of alcohol abuse and dependence.  Patient was recently hospitalized for alcoholic ketoacidosis and discharged 8/30. Pt admitted for hypotension, AKI, UTI and HCAP    -Rapid response was called last night due to worsening SOB and anxiety. Patient transferred to ICU. Was intubated last night. Per chart patient aspirated peri intubation. Suspected ALI. -Per abdominal x-ray 9/1 dilated small bowel loops concerning for obstruction vs ileus. -Per CT abd/pelvis 9/1 findings consistent with high-grade small bowel obstruction with transition in right hemipelvis likely secondary to adhesions.  Patient is intubated and sedated. On PRVC mode with FiO2 100% and PEEP 16 cmH2O. Patient is being volume resuscitated. Currently hemodynamically unstable (maxed on 2 pressors and starting a 3rd). Significant output from OGT. CT abdomen/pelvis showing  SBO.  Access: OGT placed 9/1; terminates in gastric fundus per CT abd/pelvis 9/1; 58 cm at corner of mouth; currently to LIS with significant output  MAP: 37-73 mmHg  Patient is currently intubated on ventilator support Ve: 9.3 L/min Temp (24hrs), Avg:99 F (37.2 C), Min:98.6 F (37 C), Max:99.3 F (37.4 C)  Propofol: N/A  Medications reviewed and include: Colace, folic acid 1 mg daily, Solu-Cortef 50 mg Q6hrs IV, MVI daily, pantoprazole, sertraline, thiamine 100 mg daily, vitamin B12 100 micrograms daily PO, human albumin 50 grams Q6hrs IV today, Precedex gtt, famotidine, fentanyl gtt, LR @ 150 mL/hr, phenylephrine gtt at 400 mcg/min, Zosyn, sodium chloride 1 L bolus today, vancomycin, vasopressin gtt at 0.03 units/min.  Labs reviewed: CBG 88, BUN 46, Creatinine 1.84, Lactic Acid 3.4.  I/O: 580 mL UOP yesterday (0.3 mL/kg/hr); 5400 mL output from OGT  Patient does not meet criteria for malnutrition.  Discussed with RN. Patient will also be starting norepinephrine gtt.  Diet Order:   Diet Order            Diet regular Room service appropriate? Yes; Fluid consistency: Thin  Diet effective now              EDUCATION NEEDS:   No education needs have been identified at this time  Skin:  Skin Assessment: Reviewed RN Assessment  Last BM:  12/01/2017 - smear type 4  Height:   Ht Readings from Last 1 Encounters:  11/12/2017 5\' 4"  (1.626 m)    Weight:   Wt Readings from Last 1 Encounters:  11/17/2017 78.5 kg    Ideal Body Weight:  54.5 kg  BMI:  Body mass index is 29.71 kg/m.  Estimated Nutritional Needs:  Kcal:  1641 (PSU 2003b w/ MSJ 1374, Ve 9.3, Tmax 37.4)  Protein:  95-118 grams (1.2-1.5 grams/kg)  Fluid:  1.6-1.9 L/day (30-35 mL/kg IBW)  Willey Blade, MS, RD, LDN Office: [phone] Pager: [phone] After Hours/Weekend Pager: [phone]

## 2017-12-02 NOTE — Progress Notes (Signed)
Name: Deanna Haynes MRN: [id number] DOB: 03-04-1966     CONSULTATION DATE: 11/29/2017  Subjective & objectives: Patient remains on a ventilator, pressors, increased FiO2 requirement 100% PEEP of 16 and oliguric.  PAST MEDICAL HISTORY :   has a past medical history of Anxiety, Cervical cancer ([place]), and Vaginal cuff dehiscence.  has a past surgical history that includes Radical hysterectomy (11/26/2014); Repair vaginal cuff; and Abdominal hysterectomy. Prior to Admission medications   Medication Sig Start Date End Date Taking? Authorizing Provider  alum & mag hydroxide-simeth (MAALOX/MYLANTA) 200-200-20 MG/5ML suspension Take 30 mLs by mouth every 4 (four) hours as needed for indigestion or heartburn. 11/28/17  Yes Demetrios Loll, MD  clonazePAM (KLONOPIN) 0.5 MG tablet Take 0.5 mg by mouth daily.   Yes [provider]  ferrous sulfate 325 (65 FE) MG tablet Take 325 mg by mouth daily with breakfast.   Yes [provider]  metoprolol tartrate (LOPRESSOR) 25 MG tablet Take 1 tablet (25 mg total) by mouth 2 (two) times daily. 11/29/17  Yes Demetrios Loll, MD  Multiple Vitamins-Minerals (MULTIVITAMIN WITH MINERALS) tablet Take 1 tablet by mouth daily.   Yes [provider]  pantoprazole (PROTONIX) 40 MG tablet Take 1 tablet (40 mg total) by mouth 2 (two) times daily before a meal. 11/28/17  Yes Demetrios Loll, MD  promethazine (PHENERGAN) 25 MG tablet Take 25 mg by mouth every 6 (six) hours as needed for nausea/vomiting. 11/26/17  Yes [provider]  rOPINIRole (REQUIP) 0.5 MG tablet Take 0.5 mg by mouth at bedtime. 11/14/17  Yes [provider]  sertraline (ZOLOFT) 100 MG tablet Take 100 mg by mouth at bedtime. 11/14/17  Yes [provider]  traZODone (DESYREL) 50 MG tablet Take 50 mg by mouth at bedtime.   Yes [provider]  vitamin B-12 (CYANOCOBALAMIN) 100 MCG tablet Take 100 mcg by mouth daily.   Yes [provider]   Allergies    Allergen Reactions  . Codeine Nausea And Vomiting    FAMILY HISTORY:  family history includes Anxiety disorder in her mother; Cervical cancer in her mother. SOCIAL HISTORY:  reports that she quit smoking about 3 years ago. Her smoking use included cigarettes. She has a 20.00 pack-year smoking history. She has never used smokeless tobacco. She reports that she drinks alcohol. She reports that she does not use drugs.  REVIEW OF SYSTEMS:   Unable to obtain due to critical illness   VITAL SIGNS: Temp:  [98.3 F (36.8 C)-100.3 F (37.9 C)] 98.3 F (36.8 C) (09/02 1200) Pulse Rate:  [91-126] 94 (09/02 1200) Resp:  [12-34] 16 (09/02 1200) BP: (45-165)/(19-140) 73/33 (09/02 1200) SpO2:  [72 %-100 %] 90 % (09/02 1200) Arterial Line BP: (82-140)/(36-67) 119/38 (09/02 1200) FiO2 (%):  [100 %] 100 % (09/02 0903)  Physical Examination:  Sedated with fentanyl to RASS of -3 On vent, no respiratory distress, bilateral equal air entry and bibasilar fine crackles S1 & S2 are audible with no murmur Benign abdomen, no tenderness, no rebound and silent abdomen Mottled extremities and no peripheral edema, Palpable PP  ASSESSMENT / PLAN:  Acute respiratory failure with ARDS with P/F 83 on 100% PEEP of 16 and PIP 22. On steroids -ABG, optimize ventilator settings and continuous vent support -Consider Nimbex and Low vT setting to keep PIP < 30  Altered mental status with lethargy and nonfocal neurological exam.  More likely toxic metabolic encephalopathy. -Monitor neuro status  Aspiration pneumonia.  Worsening bilateral airspace disease on  her chest x-ray. Resp Cult. GNR, GPR and MRSA PCR -ve -Empiric Zosyn + vancomycin.  Monitor CXR + CBC + FiO2  Septic shock. -Optimize hydration, pressors + hydrocortisone, monitor hemodynamics, procalcitonin and lactic acid.  Ileus.5 L of bilious secretions was suctioned as an output from orogastric tube after intubation.  High-grade small bowel  obstruction on abdominal CT likely due to adhesions -OGT to suction -N.p.o., continue to monitor lactic acid -Surgical evaluation  Lactic acidemia with sepsis Procal 0.65 -Optimize hydration, hemodynamics and monitor Lactic acid.  AKI and prerenal azotemia with intravascular volume depletion and ATN due to sepsis -Optimize hydration, hemodynamics, avoid nephrotoxins, monitor renal panel and urine output.  Mottling of fingers and toes with sepsis -wean off pressers as tolerated, optimize hemodynamics, topical NTG and consider vascular consult if no PP. -Monitor lactic acid.  Alcohol dependence -Thiamine and folic acid and watch for DTs  Hypocalcemia. -Replete and monitor electrolytes.  History of cervical cancer status post radical hysterectomy and chemotherapy.  Subcentimeter pancreatic lesion on the CT scan.  Also noticed to have vaginal spotting. -Pelvic ultrasound and consider OB/GYN and oncology consult once stable.  Full code  DVT & GI prophylaxis.  Continue with supportive care Patient sister was updated with the patient's progress, she remains in serious condition and she agreed to the plan of care Critical care time 50 minutes

## 2017-12-03 ENCOUNTER — Inpatient Hospital Stay: Payer: Managed Care, Other (non HMO)

## 2017-12-03 DIAGNOSIS — Z789 Other specified health status: Secondary | ICD-10-CM

## 2017-12-03 DIAGNOSIS — R627 Adult failure to thrive: Secondary | ICD-10-CM

## 2017-12-03 DIAGNOSIS — R0602 Shortness of breath: Secondary | ICD-10-CM

## 2017-12-03 DIAGNOSIS — Z66 Do not resuscitate: Secondary | ICD-10-CM

## 2017-12-03 DIAGNOSIS — Z7189 Other specified counseling: Secondary | ICD-10-CM

## 2017-12-03 DIAGNOSIS — F329 Major depressive disorder, single episode, unspecified: Secondary | ICD-10-CM

## 2017-12-03 DIAGNOSIS — Z515 Encounter for palliative care: Secondary | ICD-10-CM

## 2017-12-03 LAB — BASIC METABOLIC PANEL
ANION GAP: 9 (ref 5–15)
ANION GAP: 9 (ref 5–15)
BUN: 41 mg/dL — ABNORMAL HIGH (ref 6–20)
BUN: 42 mg/dL — ABNORMAL HIGH (ref 6–20)
CALCIUM: 7.2 mg/dL — AB (ref 8.9–10.3)
CALCIUM: 7.1 mg/dL — AB (ref 8.9–10.3)
CO2: 28 mmol/L (ref 22–32)
CO2: 29 mmol/L (ref 22–32)
CREATININE: 1.63 mg/dL — AB (ref 0.44–1.00)
Chloride: 107 mmol/L (ref 98–111)
Chloride: 108 mmol/L (ref 98–111)
Creatinine, Ser: 1.64 mg/dL — ABNORMAL HIGH (ref 0.44–1.00)
GFR calc Af Amer: 40 mL/min — ABNORMAL LOW (ref >60)
GFR calc Af Amer: 40 mL/min — ABNORMAL LOW (ref >60)
GFR calc non Af Amer: 34 mL/min — ABNORMAL LOW (ref >60)
GFR, EST NON AFRICAN AMERICAN: 35 mL/min — AB (ref >60)
GLUCOSE: 133 mg/dL — AB (ref 70–99)
Glucose, Bld: 147 mg/dL — ABNORMAL HIGH (ref 70–99)
POTASSIUM: 3.9 mmol/L (ref 3.5–5.1)
Potassium: 3.6 mmol/L (ref 3.5–5.1)
SODIUM: 144 mmol/L (ref 135–145)
Sodium: 146 mmol/L — ABNORMAL HIGH (ref 135–145)

## 2017-12-03 LAB — BLOOD GAS, ARTERIAL
ACID-BASE DEFICIT: 6.5 mmol/L — AB (ref 0.0–2.0)
Acid-base deficit: 5.5 mmol/L — ABNORMAL HIGH (ref 0.0–2.0)
Acid-base deficit: 5.8 mmol/L — ABNORMAL HIGH (ref 0.0–2.0)
BICARBONATE: 19.7 mmol/L — AB (ref 20.0–28.0)
Bicarbonate: 16.7 mmol/L — ABNORMAL LOW (ref 20.0–28.0)
Bicarbonate: 24.2 mmol/L (ref 20.0–28.0)
FIO2: 0.9
FIO2: 1
FIO2: 1
LHR: 16 {breaths}/min
LHR: 24 {breaths}/min
MECHVT: 360 mL
O2 Saturation: 83.4 %
O2 Saturation: 86.7 %
O2 Saturation: 97.2 %
PATIENT TEMPERATURE: 37
PCO2 ART: 23 mmHg — AB (ref 32.0–48.0)
PCO2 ART: 41 mmHg (ref 32.0–48.0)
PCO2 ART: 71 mmHg — AB (ref 32.0–48.0)
PEEP/CPAP: 10 cmH2O
PEEP/CPAP: 10 cmH2O
PEEP: 16 cmH2O
PH ART: 7.29 — AB (ref 7.350–7.450)
PO2 ART: 63 mmHg — AB (ref 83.0–108.0)
Patient temperature: 37
Patient temperature: 37
RATE: 30 resp/min
VT: 500 mL
pH, Arterial: 7.47 — ABNORMAL HIGH (ref 7.350–7.450)
pH, Arterial: 7.14 — CL (ref 7.350–7.450)
pO2, Arterial: 59 mmHg — ABNORMAL LOW (ref 83.0–108.0)
pO2, Arterial: 87 mmHg (ref 83.0–108.0)

## 2017-12-03 LAB — COMPREHENSIVE METABOLIC PANEL
ALBUMIN: 2.9 g/dL — AB (ref 3.5–5.0)
ALT: 101 U/L — ABNORMAL HIGH (ref 0–44)
AST: 271 U/L — ABNORMAL HIGH (ref 15–41)
Alkaline Phosphatase: 39 U/L (ref 38–126)
Anion gap: 16 — ABNORMAL HIGH (ref 5–15)
BILIRUBIN TOTAL: 2 mg/dL — AB (ref 0.3–1.2)
BUN: 40 mg/dL — ABNORMAL HIGH (ref 6–20)
CO2: 20 mmol/L — ABNORMAL LOW (ref 22–32)
Calcium: 6.6 mg/dL — ABNORMAL LOW (ref 8.9–10.3)
Chloride: 111 mmol/L (ref 98–111)
Creatinine, Ser: 1.5 mg/dL — ABNORMAL HIGH (ref 0.44–1.00)
GFR calc Af Amer: 45 mL/min — ABNORMAL LOW (ref >60)
GFR calc non Af Amer: 38 mL/min — ABNORMAL LOW (ref >60)
Glucose, Bld: 66 mg/dL — ABNORMAL LOW (ref 70–99)
Potassium: 2.8 mmol/L — ABNORMAL LOW (ref 3.5–5.1)
SODIUM: 147 mmol/L — AB (ref 135–145)
Total Protein: 4.9 g/dL — ABNORMAL LOW (ref 6.5–8.1)

## 2017-12-03 LAB — PATHOLOGIST SMEAR REVIEW

## 2017-12-03 LAB — CBC WITH DIFFERENTIAL/PLATELET
BAND NEUTROPHILS: 15 %
BASOS ABS: 0 10*3/uL (ref 0–0.1)
BASOS PCT: 0 %
BLASTS: 0 %
Band Neutrophils: 76 %
Basophils Absolute: 0 10*3/uL (ref 0–0.1)
Basophils Relative: 0 %
Blasts: 0 %
EOS ABS: 0 10*3/uL (ref 0–0.7)
EOS PCT: 0 %
Eosinophils Absolute: 0 10*3/uL (ref 0–0.7)
Eosinophils Relative: 0 %
HCT: 29.3 % — ABNORMAL LOW (ref 35.0–47.0)
HCT: 32.4 % — ABNORMAL LOW (ref 35.0–47.0)
HEMOGLOBIN: 10.1 g/dL — AB (ref 12.0–16.0)
Hemoglobin: 10.6 g/dL — ABNORMAL LOW (ref 12.0–16.0)
LYMPHS ABS: 0.6 10*3/uL — AB (ref 1.0–3.6)
LYMPHS PCT: 11 %
LYMPHS PCT: 2 %
Lymphs Abs: 2.2 10*3/uL (ref 1.0–3.6)
MCH: 28.6 pg (ref 26.0–34.0)
MCH: 30.2 pg (ref 26.0–34.0)
MCHC: 32.9 g/dL (ref 32.0–36.0)
MCHC: 34.4 g/dL (ref 32.0–36.0)
MCV: 86.9 fL (ref 80.0–100.0)
MCV: 87.7 fL (ref 80.0–100.0)
MONO ABS: 2.3 10*3/uL — AB (ref 0.2–0.9)
MONOS PCT: 7 %
Metamyelocytes Relative: 0 %
Metamyelocytes Relative: 6 %
Monocytes Absolute: 1.2 10*3/uL — ABNORMAL HIGH (ref 0.2–0.9)
Monocytes Relative: 6 %
Myelocytes: 0 %
Myelocytes: 0 %
NEUTROS ABS: 16.4 10*3/uL — AB (ref 1.4–6.5)
NEUTROS PCT: 68 %
NEUTROS PCT: 9 %
NRBC: 0 /100{WBCs}
NRBC: 2 /100{WBCs} — AB
Neutro Abs: 29.3 10*3/uL — ABNORMAL HIGH (ref 1.4–6.5)
OTHER: 0 %
OTHER: 0 %
PLATELETS: 200 10*3/uL (ref 150–440)
Platelets: 206 10*3/uL (ref 150–440)
Promyelocytes Relative: 0 %
Promyelocytes Relative: 0 %
RBC: 3.33 MIL/uL — ABNORMAL LOW (ref 3.80–5.20)
RBC: 3.72 MIL/uL — AB (ref 3.80–5.20)
RDW: 13.8 % (ref 11.5–14.5)
RDW: 14 % (ref 11.5–14.5)
WBC: 19.8 10*3/uL — ABNORMAL HIGH (ref 3.6–11.0)
WBC: 32.2 10*3/uL — AB (ref 3.6–11.0)

## 2017-12-03 LAB — URINE CULTURE

## 2017-12-03 LAB — GLUCOSE, CAPILLARY
GLUCOSE-CAPILLARY: 164 mg/dL — AB (ref 70–99)
GLUCOSE-CAPILLARY: 132 mg/dL — AB (ref 70–99)
Glucose-Capillary: 57 mg/dL — ABNORMAL LOW (ref 70–99)

## 2017-12-03 LAB — MAGNESIUM
Magnesium: 1.9 mg/dL (ref 1.7–2.4)
Magnesium: 1.9 mg/dL (ref 1.7–2.4)

## 2017-12-03 LAB — HEMOGLOBIN AND HEMATOCRIT, BLOOD
HCT: 30.9 % — ABNORMAL LOW (ref 35.0–47.0)
Hemoglobin: 10.4 g/dL — ABNORMAL LOW (ref 12.0–16.0)

## 2017-12-03 LAB — PHOSPHORUS
Phosphorus: 4 mg/dL (ref 2.5–4.6)
Phosphorus: 5.2 mg/dL — ABNORMAL HIGH (ref 2.5–4.6)

## 2017-12-03 LAB — CALCIUM, IONIZED: Calcium, Ionized, Serum: 4.2 mg/dL — ABNORMAL LOW (ref 4.5–5.6)

## 2017-12-03 MED ORDER — FENTANYL 2500MCG IN NS 250ML (10MCG/ML) PREMIX INFUSION
0.0000 ug/h | INTRAVENOUS | Status: DC
  Administered 2017-12-03 (×2): 200 ug/h via INTRAVENOUS
  Filled 2017-12-03: qty 250

## 2017-12-03 MED ORDER — FENTANYL CITRATE (PF) 100 MCG/2ML IJ SOLN: 100.0000 ug | Freq: Once | INTRAMUSCULAR | Status: DC | PRN

## 2017-12-03 MED ORDER — MORPHINE SULFATE (PF) 4 MG/ML IV SOLN
4.0000 mg | Freq: Once | INTRAVENOUS | Status: AC
  Administered 2017-12-03: 4 mg via INTRAVENOUS
  Filled 2017-12-03: qty 1

## 2017-12-03 MED ORDER — FUROSEMIDE 8 MG/ML PO SOLN
20.0000 mg | Freq: Two times a day (BID) | ORAL | Status: DC
  Filled 2017-12-03 (×2): qty 5

## 2017-12-03 MED ORDER — FOLIC ACID 5 MG/ML IJ SOLN
1.0000 mg | Freq: Every day | INTRAMUSCULAR | Status: DC
  Administered 2017-12-03: 1 mg via INTRAVENOUS
  Filled 2017-12-03: qty 0.2

## 2017-12-03 MED ORDER — LORAZEPAM 2 MG/ML IJ SOLN
2.0000 mg | Freq: Once | INTRAMUSCULAR | Status: AC
  Administered 2017-12-03: 2 mg via INTRAVENOUS
  Filled 2017-12-03: qty 1

## 2017-12-03 MED ORDER — SODIUM CHLORIDE 0.9 % IV SOLN
2.0000 mg/h | INTRAVENOUS | Status: DC
  Administered 2017-12-03: 2 mg/h via INTRAVENOUS
  Filled 2017-12-03: qty 10

## 2017-12-03 MED ORDER — DEXTROSE 50 % IV SOLN
1.0000 | Freq: Once | INTRAVENOUS | Status: AC
  Administered 2017-12-03: 50 mL via INTRAVENOUS

## 2017-12-03 MED ORDER — FUROSEMIDE 10 MG/ML IJ SOLN
20.0000 mg | Freq: Once | INTRAMUSCULAR | Status: AC
  Administered 2017-12-03: 20 mg via INTRAVENOUS

## 2017-12-03 MED ORDER — FUROSEMIDE 10 MG/ML IJ SOLN
INTRAMUSCULAR | Status: AC
  Administered 2017-12-03: 20 mg via INTRAVENOUS
  Filled 2017-12-03: qty 2

## 2017-12-03 MED ORDER — MORPHINE SULFATE (PF) 4 MG/ML IV SOLN: 4.0000 mg | INTRAVENOUS | Status: DC | PRN

## 2017-12-03 MED ORDER — HYDROCORTISONE NA SUCCINATE PF 100 MG IJ SOLR
50.0000 mg | Freq: Three times a day (TID) | INTRAMUSCULAR | Status: DC
  Administered 2017-12-03: 50 mg via INTRAVENOUS
  Filled 2017-12-03: qty 2

## 2017-12-03 MED ORDER — POLYVINYL ALCOHOL 1.4 % OP SOLN
1.0000 [drp] | Freq: Four times a day (QID) | OPHTHALMIC | Status: DC | PRN
  Filled 2017-12-03: qty 15

## 2017-12-03 MED ORDER — SODIUM CHLORIDE 0.9 % IV SOLN
2.0000 g | Freq: Once | INTRAVENOUS | Status: AC
  Administered 2017-12-03: 2 g via INTRAVENOUS
  Filled 2017-12-03: qty 20

## 2017-12-03 MED ORDER — FAMOTIDINE IN NACL 20-0.9 MG/50ML-% IV SOLN: 20.0000 mg | INTRAVENOUS | Status: DC

## 2017-12-03 MED ORDER — MIDAZOLAM BOLUS VIA INFUSION
2.0000 mg | INTRAVENOUS | Status: DC | PRN
  Filled 2017-12-03: qty 2

## 2017-12-03 MED ORDER — DEXTROSE 50 % IV SOLN
INTRAVENOUS | Status: AC
  Administered 2017-12-03: 50 mL via INTRAVENOUS
  Filled 2017-12-03: qty 50

## 2017-12-03 MED ORDER — SODIUM CHLORIDE 0.9 % IV SOLN
3.0000 ug/kg/min | INTRAVENOUS | Status: DC
  Administered 2017-12-03: 3 ug/kg/min via INTRAVENOUS
  Filled 2017-12-03: qty 20

## 2017-12-03 MED ORDER — BIOTENE DRY MOUTH MT LIQD: 15.0000 mL | OROMUCOSAL | Status: DC | PRN

## 2017-12-03 MED ORDER — LORAZEPAM 2 MG/ML IJ SOLN: 2.0000 mg | INTRAMUSCULAR | Status: DC | PRN

## 2017-12-03 MED ORDER — ARTIFICIAL TEARS OPHTHALMIC OINT
1.0000 "application " | TOPICAL_OINTMENT | Freq: Three times a day (TID) | OPHTHALMIC | Status: DC
  Administered 2017-12-03 (×2): 1 via OPHTHALMIC
  Filled 2017-12-03 (×2): qty 3.5

## 2017-12-03 MED ORDER — CISATRACURIUM BOLUS VIA INFUSION
0.1000 mg/kg | Freq: Once | INTRAVENOUS | Status: AC
  Administered 2017-12-03: 9.8 mg via INTRAVENOUS
  Filled 2017-12-03: qty 10

## 2017-12-03 MED ORDER — GLYCOPYRROLATE 0.2 MG/ML IJ SOLN: 0.2000 mg | INTRAMUSCULAR | Status: DC | PRN

## 2017-12-03 MED ORDER — STERILE WATER FOR INJECTION IJ SOLN
INTRAMUSCULAR | Status: AC
  Administered 2017-12-03: 06:00:00
  Filled 2017-12-03: qty 10

## 2017-12-03 MED ORDER — FUROSEMIDE 10 MG/ML IJ SOLN
20.0000 mg | Freq: Once | INTRAMUSCULAR | Status: AC
  Administered 2017-12-03: 20 mg via INTRAVENOUS
  Filled 2017-12-03: qty 2

## 2017-12-03 MED ORDER — STERILE WATER FOR INJECTION IJ SOLN
INTRAMUSCULAR | Status: AC
  Administered 2017-12-03: 01:00:00
  Filled 2017-12-03: qty 10

## 2017-12-03 MED ORDER — POTASSIUM CHLORIDE 10 MEQ/50ML IV SOLN
10.0000 meq | INTRAVENOUS | Status: DC
  Administered 2017-12-03 (×4): 10 meq via INTRAVENOUS
  Filled 2017-12-03 (×6): qty 50

## 2017-12-03 MED ORDER — VECURONIUM BROMIDE 10 MG IV SOLR
10.0000 mg | INTRAVENOUS | Status: DC | PRN
  Administered 2017-12-03 (×3): 10 mg via INTRAVENOUS
  Filled 2017-12-03 (×3): qty 10

## 2017-12-04 LAB — CALCIUM, IONIZED
CALCIUM, IONIZED, SERUM: 3.8 mg/dL — AB (ref 4.5–5.6)
CALCIUM, IONIZED, SERUM: 3.9 mg/dL — AB (ref 4.5–5.6)
Calcium, Ionized, Serum: 3.8 mg/dL — ABNORMAL LOW (ref 4.5–5.6)
Calcium, Ionized, Serum: 3.6 mg/dL — ABNORMAL LOW (ref 4.5–5.6)
Calcium, Ionized, Serum: 4.1 mg/dL — ABNORMAL LOW (ref 4.5–5.6)

## 2017-12-04 LAB — CULTURE, RESPIRATORY W GRAM STAIN: Special Requests: NORMAL

## 2017-12-04 LAB — CULTURE, RESPIRATORY

## 2017-12-05 ENCOUNTER — Telehealth: Payer: Self-pay | Admitting: *Deleted

## 2017-12-05 NOTE — Telephone Encounter (Signed)
Death certificate received via fax from [facility]. This is a cremation and the original will be brought over later to sign. Request to be faxed to [phone]  Will place in provider folder for completion.

## 2017-12-06 LAB — CULTURE, BLOOD (ROUTINE X 2)
CULTURE: NO GROWTH
Culture: NO GROWTH
Special Requests: ADEQUATE

## 2017-12-06 NOTE — Telephone Encounter (Signed)
Informed funeral home death cert is ready for pick up.

## 2017-12-06 NOTE — Telephone Encounter (Signed)
Deanna Haynes dropped off Death Certificate to be completed and signed Placed in nurse box

## 2017-12-20 LAB — BLOOD GAS, ARTERIAL
Acid-base deficit: 2.6 mmol/L — ABNORMAL HIGH (ref 0.0–2.0)
Bicarbonate: 23.2 mmol/L (ref 20.0–28.0)
FIO2: 0.44
O2 SAT: 91 %
PATIENT TEMPERATURE: 37
PO2 ART: 65 mmHg — AB (ref 83.0–108.0)
pCO2 arterial: 43 mmHg (ref 32.0–48.0)
pH, Arterial: 7.34 — ABNORMAL LOW (ref 7.350–7.450)

## 2017-12-23 DIAGNOSIS — K5652 Intestinal adhesions [bands] with complete obstruction: Secondary | ICD-10-CM

## 2017-12-31 NOTE — Death Summary Note (Addendum)
COMPLETED FOR DR [place] SUMMARY   Patient Details  Name: Deanna Haynes MRN: [id number] DOB: 02/08/1966  Admission/Discharge Information   Admit Date:  12-11-17  Date of Death:  12/14/2017  Time of Death:  1613/07/07  Length of Stay: 2  Referring Physician: Remi Haggard, FNP   Reason(s) for Hospitalization  resp failure with ARDS  Diagnoses  Preliminary cause of death: ARDS, SEPTIC SHOCK SBO LACTIC ACIDOSIS Secondary Diagnoses (including complications and co-morbidities):  Active Problems:   Hypotension   Acute respiratory failure with hypoxia (HCC)   Intestinal adhesions with complete obstruction [facility])   Brief Hospital Course (including significant findings, care, treatment, and services provided and events leading to death)  Admitted for SBO and multiorgan failure Multiple pressors Vent support Patient was made DNR  Family decided to proceed with comfort care measures   Pertinent Labs and Studies  Significant Diagnostic Studies Dg Chest 1 View  Result Date: 12/01/2017 CLINICAL DATA:  52 year old female with history of hypoxia. Shortness of breath. History of cervical cancer. Former smoker. EXAM: CHEST  1 VIEW COMPARISON:  Chest x-ray 2017/12/11. FINDINGS: Low lung volumes. Patchy ill-defined opacities in lung bases bilaterally may reflect atelectasis and/or airspace consolidation. Small bilateral pleural effusions. No evidence of pulmonary edema. Heart size is normal. Upper mediastinal contours are within normal limits. IMPRESSION: 1. Low lung volumes with bibasilar areas of atelectasis and/or consolidation and small bilateral pleural effusions. Electronically Signed   By: Vinnie Langton M.D.   On: 12/01/2017 19:51   Dg Chest 1 View  Result Date: 12/11/2017 CLINICAL DATA:  Patient with weakness and hypotension. EXAM: CHEST  1 VIEW COMPARISON:  None. FINDINGS: Monitoring leads overlie the patient. Normal cardiac and mediastinal contours. Heterogeneous opacities  right mid lower lung and left lung base. No pleural effusion or pneumothorax. IMPRESSION: Heterogeneous opacities right mid lower lung and left lung base may represent pneumonia in the appropriate clinical setting. Followup PA and lateral chest X-ray is recommended in 3-4 weeks following trial of antibiotic therapy to ensure resolution and exclude underlying malignancy. Electronically Signed   By: Lovey Newcomer M.D.   On: 12-11-17 22:07   Ct Abdomen Pelvis W Contrast  Result Date: 12/01/2017 CLINICAL DATA:  52 year old female with concern for bowel obstruction. Status post intubation and NGT placement. History of cervical cancer. EXAM: CT ABDOMEN AND PELVIS WITH CONTRAST TECHNIQUE: Multidetector CT imaging of the abdomen and pelvis was performed using the standard protocol following bolus administration of intravenous contrast. CONTRAST:  146mL ISOVUE-300 IOPAMIDOL (ISOVUE-300) INJECTION 61% COMPARISON:  Abdominal radiograph dated 12/01/2017 and CT dated 08/01/2015 FINDINGS: Lower chest: Small bilateral pleural effusions. Partial bilateral lower lobe consolidation with air bronchogram may represent atelectasis versus infiltrate. Patchy areas of airspace ground-glass density noted throughout the visualized lungs concerning for multifocal pneumonia. Clinical correlation is recommended. There is no intra-abdominal free air. Small free fluid within the pelvis. Hepatobiliary: No focal liver abnormality is seen. No gallstones, gallbladder wall thickening, or biliary dilatation. Pancreas: There is a 7 mm hypodense lesion with a recently rim of calcification in the body of the pancreas which is similar to the study of 07-08-2015. The stability over the course of 2 years represents a benign or indolent process. No dilatation of the main pancreatic duct or gland atrophy. No active inflammatory changes. Spleen: Normal in size without focal abnormality. Adrenals/Urinary Tract: Indeterminate 10 mm left adrenal nodule. The right  adrenal gland is unremarkable. There is no hydronephrosis on either side. Subcentimeter right renal  hypodense lesion is too small to characterize wall most consistent with a cyst. The visualized ureters are unremarkable. The urinary bladder is decompressed around a Foley catheter. Stomach/Bowel: There is dilatation of the fluid-filled loops of small bowel throughout the abdomen measuring up to 5.2 cm in caliber. There is a focal area of transition in the right hemipelvis (series 2, image 83 and coronal series 5, image 55) likely secondary to adhesions. An enteric tube is seen with tip and side-port in the gastric fundus. The stomach is moderately distended with liquid content. There is small amount of fluid in the distal esophagus. The appendix is normal. Vascular/Lymphatic: There is mild aortoiliac atherosclerotic disease. A right femoral approach venous line with tip in the right common iliac vein. No portal venous gas. There is no adenopathy. Reproductive: Hysterectomy.  No pelvic mass. Other: Mild diffuse subcutaneous edema. Musculoskeletal: No acute or significant osseous findings. IMPRESSION: 1. High-grade small bowel obstruction with transition in the right hemipelvis likely secondary to adhesions. 2. Small bilateral pleural effusions and bibasilar consolidative changes. Patchy bilateral airspace densities concerning for multifocal pneumonia. Clinical correlation is recommended. The 3. A subcentimeter hypodense lesion in the body of the pancreas appears similar to prior CT. Electronically Signed   By: Anner Crete M.D.   On: 12/01/2017 22:39   Dg Chest Port 1 View  Result Date: 12/15/2017 CLINICAL DATA:  Pneumonia. EXAM: PORTABLE CHEST 1 VIEW COMPARISON:  Radiograph of December 02, 2017. FINDINGS: The heart size and mediastinal contours are within normal limits. Endotracheal and nasogastric tubes are unchanged in position. No pneumothorax is noted. Stable bilateral lung opacities are noted, left  greater than right concerning for pneumonia. Bony thorax is unremarkable. No large pleural effusion is noted. The visualized skeletal structures are unremarkable. IMPRESSION: Stable support apparatus. Stable bilateral lung opacities as described above. Electronically Signed   By: Marijo Conception, M.D.   On: December 15, 2017 08:10   Dg Chest Port 1 View  Result Date: 12/02/2017 CLINICAL DATA:  Acute respiratory failure EXAM: PORTABLE CHEST 1 VIEW COMPARISON:  December 02, 2017 FINDINGS: The ET and NG tubes are stable. No pneumothorax. Infiltrate left mid lower lung is stable. Infiltrate in the right base is slightly improved. No other change. IMPRESSION: Slight improvement in the right basilar infiltrate. Stable left mid lower lung infiltrate. Stable support apparatus. Electronically Signed   By: Dorise Bullion III M.D   On: 12/02/2017 23:09   Dg Chest Port 1 View  Result Date: 12/02/2017 CLINICAL DATA:  Pneumonia EXAM: PORTABLE CHEST 1 VIEW COMPARISON:  12/01/2017 FINDINGS: Endotracheal tube is in place, tip 5.8 centimeters above the carina. A nasogastric tube is in place, tip overlying the level of the stomach. Heart size is normal. There patchy airspace filling opacities bilaterally, partially obscuring the hemidiaphragms and stable in appearance. IMPRESSION: Persistent bilateral airspace filling opacities. Electronically Signed   By: Nolon Nations M.D.   On: 12/02/2017 08:39   Dg Chest Port 1 View  Result Date: 12/01/2017 CLINICAL DATA:  Acute respiratory failure. EXAM: PORTABLE CHEST 1 VIEW COMPARISON:  Chest radiograph December 01, 2017 at 2030 hours FINDINGS: Endotracheal tube tip projects 4.3 cm above the carina. Nasogastric tube tip projects in proximal stomach. Market interval worsening of interstitial densities mid and lower lung zone. Bibasilar consolidation with air bronchograms. Potential small pleural effusions. Cardiomediastinal silhouette is normal. No pneumothorax. Soft tissue planes and  included osseous structures are unchanged. IMPRESSION: 1. Endotracheal tube tip projects 4.3 cm above the carina. Nasogastric tube  tip projects in proximal stomach. 2. Marked worsening of interstitial and alveolar airspace opacities. Small potential pleural effusions. Electronically Signed   By: Elon Alas M.D.   On: 12/01/2017 23:51   Dg Chest Port 1 View  Result Date: 12/01/2017 CLINICAL DATA:  52 year old female status post intubation NG tube placement. EXAM: PORTABLE CHEST 1 VIEW COMPARISON:  CT of the abdomen pelvis dated 08/01/2015 FINDINGS: An endotracheal tube is noted with tip approximately 4.5 cm above the carina. An enteric tube extends below the diaphragm with side port and tip in the left upper abdomen likely in the proximal stomach. There is flattening of the left hemidiaphragm and blunting the left costophrenic angle which may be related to chronic changes and scarring or a small pleural effusion. Minimal bilateral perihilar and infrahilar streaky densities, likely atelectatic changes. There is no pneumothorax. Dilated small bowel loops in the left in the abdomen measures 5.5 cm in diameter. There is no free air or radiopaque calculi. The osseous structures and soft tissues are grossly unremarkable. IMPRESSION: 1. Endotracheal tube above the carina and enteric tube in the proximal stomach. 2. Probable small left pleural effusion. 3. Dilated small bowel loops concerning for obstruction versus ileus. Electronically Signed   By: Anner Crete M.D.   On: 12/01/2017 21:05   Dg Abd Portable 1v  Result Date: 12/01/2017 CLINICAL DATA:  52 year old female status post intubation NG tube placement. EXAM: PORTABLE CHEST 1 VIEW COMPARISON:  CT of the abdomen pelvis dated 08/01/2015 FINDINGS: An endotracheal tube is noted with tip approximately 4.5 cm above the carina. An enteric tube extends below the diaphragm with side port and tip in the left upper abdomen likely in the proximal stomach. There  is flattening of the left hemidiaphragm and blunting the left costophrenic angle which may be related to chronic changes and scarring or a small pleural effusion. Minimal bilateral perihilar and infrahilar streaky densities, likely atelectatic changes. There is no pneumothorax. Dilated small bowel loops in the left in the abdomen measures 5.5 cm in diameter. There is no free air or radiopaque calculi. The osseous structures and soft tissues are grossly unremarkable. IMPRESSION: 1. Endotracheal tube above the carina and enteric tube in the proximal stomach. 2. Probable small left pleural effusion. 3. Dilated small bowel loops concerning for obstruction versus ileus. Electronically Signed   By: Anner Crete M.D.   On: 12/01/2017 21:05    Microbiology No results found for this or any previous visit (from the past 240 hour(s)).  Lab Basic Metabolic Panel: No results for input(s): NA, K, CL, CO2, GLUCOSE, BUN, CREATININE, CALCIUM, MG, PHOS in the last 168 hours. Liver Function Tests: No results for input(s): AST, ALT, ALKPHOS, BILITOT, PROT, ALBUMIN in the last 168 hours. No results for input(s): LIPASE, AMYLASE in the last 168 hours. No results for input(s): AMMONIA in the last 168 hours. CBC: No results for input(s): WBC, NEUTROABS, HGB, HCT, MCV, PLT in the last 168 hours. Cardiac Enzymes: No results for input(s): CKTOTAL, CKMB, CKMBINDEX, TROPONINI in the last 168 hours. Sepsis Labs: No results for input(s): PROCALCITON, WBC, LATICACIDVEN in the last 168 hours.    Denim Kalmbach 12/30/2017, 1:50 PM

## 2017-12-31 NOTE — Progress Notes (Signed)
Patient extubated to comfort care on 2L nasal cannula per MD order.

## 2017-12-31 NOTE — Progress Notes (Signed)
Chaplain received PG for patient getting ready to be extubated. Chaplain arrived and friends of patient was there and Chaplain made pastoral presence know and offer support, encouragement, prayer, peace and comfort until everyone left.

## 2017-12-31 NOTE — Progress Notes (Signed)
Pt extubated to 2Lnc for comfort at 1605 with friends present at bedside. Asystole showing on monitor at 1615, this RN and Osborne Oman, NP went to bedside to verify patient had passed by independently listening for heart tones and breaths. Time of death called at 47 with friends present at bedside. Emotional support provided to friends by this RN and chaplain who was present at time of patient death. CDS called, patient is a candidate for eye donation. Eye prep completed. CDS reference # [id number]

## 2017-12-31 NOTE — Progress Notes (Signed)
Palliative Note:  Family has requested for terminal extubation. Eustaquio Boyden at the bedside with other friends and co-workers along with chaplain support. Medical team at the bedside and orders were given to Judson Roch, RN for medications prior to extubation. Patient was extubated to 2L/ at 1605. Rita, friend/POA remained at bedside during procedure and aware that patient will receive comfort care. Patient tolerated extubation with no signs of distress or discomfort. She remained unresponsive. Friends at the bedside with [place]. Continuous support provided to friends. Patient showed Asystole on monitor at 1615 per Judson Roch, RN. Myself and Sarah, RN assessed patient for heart and breath sounds. Time of death at 72 as confirmed by myself and Judson Roch, Therapist, sports. Family was made aware and emotional support provided to friends.    Time In: 1550 Time Out: 1645 Total Time:55 min.   Greater than 50%  of this time was spent counseling and coordinating care related to the above assessment and plan  Alda Lea, AGPCNP-BC Palliative Medicine Team  Phone: [phone] Fax: [phone] Pager: [phone] Amion: N. [place]

## 2017-12-31 NOTE — Progress Notes (Signed)
Speaking with pt emergency contact Christina at beside about patient current situation and seriousness of illness. Margreta Journey mentioned in speaking that pt was a DNR. Conversation was returned to DNR status, Margreta Journey informed that patient was currently a full code. Spoke with Eustaquio Boyden, Velva Harman then was contacted by NP and pt was made a DNR as expressed by Velva Harman.

## 2017-12-31 NOTE — Progress Notes (Signed)
Pt's friend Margreta Journey visiting at bedside, states that Mrs. Luger is a DNR.  Notarized Paperwork provided showing that Iva Boop is designated as Special educational needs teacher of attorney, and that pt is a DNR.  Called and spoke with Velva Harman to verify DNR status.  Per Velva Harman, pt is a DNR.  Orders placed for DNR/DNI status.  Paperwork placed in chart.    Darel Hong, AGACNP-BC [facility] Pager: [phone]

## 2017-12-31 NOTE — Progress Notes (Addendum)
Name: Deanna Haynes MRN: [id number] DOB: 12-15-1965     CONSULTATION DATE: 11/19/2017  Subjective & Objectives: Patient remains on vent, Precedex + Fentanyl, Vasopressin + Neosynephrine, PC 90% PEEP of 9  PAST MEDICAL HISTORY :   has a past medical history of Anxiety, Cervical cancer ([place]), and Vaginal cuff dehiscence.  has a past surgical history that includes Radical hysterectomy (11/26/2014); Repair vaginal cuff; and Abdominal hysterectomy. Prior to Admission medications   Medication Sig Start Date End Date Taking? Authorizing Provider  alum & mag hydroxide-simeth (MAALOX/MYLANTA) 200-200-20 MG/5ML suspension Take 30 mLs by mouth every 4 (four) hours as needed for indigestion or heartburn. 11/28/17  Yes Demetrios Loll, MD  clonazePAM (KLONOPIN) 0.5 MG tablet Take 0.5 mg by mouth daily.   Yes [provider]  ferrous sulfate 325 (65 FE) MG tablet Take 325 mg by mouth daily with breakfast.   Yes [provider]  metoprolol tartrate (LOPRESSOR) 25 MG tablet Take 1 tablet (25 mg total) by mouth 2 (two) times daily. 11/29/17  Yes Demetrios Loll, MD  Multiple Vitamins-Minerals (MULTIVITAMIN WITH MINERALS) tablet Take 1 tablet by mouth daily.   Yes [provider]  pantoprazole (PROTONIX) 40 MG tablet Take 1 tablet (40 mg total) by mouth 2 (two) times daily before a meal. 11/28/17  Yes Demetrios Loll, MD  promethazine (PHENERGAN) 25 MG tablet Take 25 mg by mouth every 6 (six) hours as needed for nausea/vomiting. 11/26/17  Yes [provider]  rOPINIRole (REQUIP) 0.5 MG tablet Take 0.5 mg by mouth at bedtime. 11/14/17  Yes [provider]  sertraline (ZOLOFT) 100 MG tablet Take 100 mg by mouth at bedtime. 11/14/17  Yes [provider]  traZODone (DESYREL) 50 MG tablet Take 50 mg by mouth at bedtime.   Yes [provider]  vitamin B-12 (CYANOCOBALAMIN) 100 MCG tablet Take 100 mcg by mouth daily.   Yes [provider]   Allergies  Allergen  Reactions  . Codeine Nausea And Vomiting    FAMILY HISTORY:  family history includes Anxiety disorder in her mother; Cervical cancer in her mother. SOCIAL HISTORY:  reports that she quit smoking about 3 years ago. Her smoking use included cigarettes. She has a 20.00 pack-year smoking history. She has never used smokeless tobacco. She reports that she drinks alcohol. She reports that she does not use drugs.  REVIEW OF SYSTEMS:   Unable to obtain due to critical illness   VITAL SIGNS: Temp:  [98.3 F (36.8 C)-101.5 F (38.6 C)] 98.7 F (37.1 C) (09/03 0749) Pulse Rate:  [76-102] 76 (09/03 0700) Resp:  [12-26] 14 (09/03 0700) BP: (49-155)/(19-135) 83/43 (09/03 0700) SpO2:  [81 %-100 %] 90 % (09/03 0752) Arterial Line BP: (90-170)/(36-72) 135/54 (09/03 0700) FiO2 (%):  [90 %-100 %] 90 % (09/03 0752) Weight:  [98 kg] 98 kg (09/03 0500)   Physical Examination: Sedated with fentanyl to RASS of -3 On vent, no respiratory distress, bilateral equal air entry and no rales S1 & S2 are audible with no murmur Benign abdomen, no tenderness, no rebound and silent abdomen Mottled extremities (improved) and no peripheral edema, Palpable PT bil.  ASSESSMENT / PLAN:  Acute respiratory failure with ARDS with P/F 96 on 90% PEEP of 10 and PIP 36. On steroids -ABG, optimize ventilator settings (Keep PIP < 30, accept permissive hypercapnea) and continuous vent support -Consider Nimbex and Low vT setting to keep PIP < 30  Altered mental status with lethargy and nonfocal neurological exam (improved and  following commands on light sedation. More likely toxic metabolic encephalopathy. -Monitor neuro status  Aspiration pneumonia.improved bilateral airspace disease on her chest x-ray. Resp Cult. GNR, GPR and MRSA PCR -ve -c/w Zosyn + d/c vancomycin. Monitor CXR + CBC + FiO2  Septic shock. -Optimize hydration, pressors + taper hydrocortisone, monitor hemodynamics, procalcitonin and lactic  acid.  Ileus.5 L of bilious secretions was suctioned as an output from orogastric tube after intubation. High-grade small bowel obstruction on abdominal CT likely due to adhesions -OGT to suction -N.p.o., continue to monitor lactic acid -Surgery advised conservative management for now  Lactic acidemia with sepsis Procal 0.65 -Optimize hydration, hemodynamics and monitor Lactic acid.  AKIandprerenal azotemia withintravascular volume depletion and ATN due to sepsis -Optimize hydration, hemodynamics, avoid nephrotoxins, monitor renal panel and urine output.  Mottling of fingers and toes with sepsis -wean off pressers as tolerated, optimize hemodynamics, topical NTG and consider vascular consult if no PP. -Monitor lactic acid.  Alcohol dependence -Thiamine and folic acid and watch for DTs  Hypocalcemia and hypokalemia -Replete and monitor electrolytes.  History of cervical cancer status post radical hysterectomy and chemotherapy.Subcentimeter pancreatic lesion on the CT scan. Also noticed to have vaginal spotting. -Pelvic ultrasound and consider OB/GYN and oncology consult once stable.  DNR  DVT &GI prophylaxis. Continue with supportive care Patient sister was updated with the patient's progress, she remains in serious condition and she agreed to the plan of care Critical care time 40 minutes   02:20 pm I met with the patient power of attorney.  She requested withdrawal of care and starting comfort measures as per the patient wishes not to be on any kind of life support for more than 1 day.  The patient progress was discussed with POA in details including the patient limited the chances for complete recovery to baseline status.
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
## 2017-12-31 NOTE — Progress Notes (Signed)
[facility] [place] NAME: Deanna Haynes    MR#:  [id number]  DATE OF BIRTH:  05-Mar-1966  SUBJECTIVE:  CHIEF COMPLAINT:   Chief Complaint  Patient presents with  . Hypotension  . Weakness   The patient is on vent and sedation.  on vasopressor drips. REVIEW OF SYSTEMS:  Review of Systems  Unable to perform ROS: Intubated    DRUG ALLERGIES:   Allergies  Allergen Reactions  . Codeine Nausea And Vomiting   VITALS:  Blood pressure (!) 99/52, pulse (!) 110, temperature 98.7 F (37.1 C), temperature source Axillary, resp. rate (!) 30, height 5\' 4"  (1.626 m), weight 98 kg, SpO2 (!) 88 %. PHYSICAL EXAMINATION:  Physical Exam  Constitutional: She appears well-developed.  HENT:  Head: Normocephalic.  Eyes: Pupils are equal, round, and reactive to light. Conjunctivae are normal. No scleral icterus.  Neck: Neck supple. No JVD present. No tracheal deviation present.  Cardiovascular: Normal rate, regular rhythm and normal heart sounds. Exam reveals no gallop.  No murmur heard. Pulmonary/Chest: No respiratory distress. She has no wheezes. She has no rales.  Bilateral crackles.  Abdominal: Soft. Bowel sounds are normal. She exhibits no distension. There is no tenderness. There is no rebound.  Musculoskeletal: She exhibits no edema.  Neurological:  Intubated, on vent.  Skin: No rash noted. No erythema.   LABORATORY PANEL:  Female CBC Recent Labs  Lab 12/02/17 2348 12/15/2017 0500  WBC 19.8*  --   HGB 10.1* 10.4*  HCT 29.3* 30.9*  PLT 206  --    ------------------------------------------------------------------------------------------------------------------ Chemistries  Recent Labs  Lab 12/15/17 0500  NA 147*  K 2.8*  CL 111  CO2 20*  GLUCOSE 66*  BUN 40*  CREATININE 1.50*  CALCIUM 6.6*  MG 1.9  AST 271*  ALT 101*  ALKPHOS 39  BILITOT 2.0*   RADIOLOGY:  Dg Chest Port 1 View  Result Date: 12/15/17 CLINICAL DATA:   Pneumonia. EXAM: PORTABLE CHEST 1 VIEW COMPARISON:  Radiograph of December 02, 2017. FINDINGS: The heart size and mediastinal contours are within normal limits. Endotracheal and nasogastric tubes are unchanged in position. No pneumothorax is noted. Stable bilateral lung opacities are noted, left greater than right concerning for pneumonia. Bony thorax is unremarkable. No large pleural effusion is noted. The visualized skeletal structures are unremarkable. IMPRESSION: Stable support apparatus. Stable bilateral lung opacities as described above. Electronically Signed   By: Marijo Conception, M.D.   On: Dec 15, 2017 08:10   Dg Chest Port 1 View  Result Date: 12/02/2017 CLINICAL DATA:  Acute respiratory failure EXAM: PORTABLE CHEST 1 VIEW COMPARISON:  December 02, 2017 FINDINGS: The ET and NG tubes are stable. No pneumothorax. Infiltrate left mid lower lung is stable. Infiltrate in the right base is slightly improved. No other change. IMPRESSION: Slight improvement in the right basilar infiltrate. Stable left mid lower lung infiltrate. Stable support apparatus. Electronically Signed   By: Dorise Bullion III M.D   On: 12/02/2017 23:09   ASSESSMENT AND PLAN:   1.  Sepsis (Hypotension, tachycardia, leukocytosis) due to HCP/aspiration pneumonia and UTI Continue Zosyn and vancomycin, nebulizer, Follow-up CBC and cultures. Blood culture is negative so far.  2.    Acute respiratory failure with hypoxia due to HAP/aspiration pneumonia Continue ventilation.  * Hypotension due to above. Continue vasopressors and IVF.  3.  ARF, likely prerenal, secondary to above.   Continue IV fluids and follow-up BMP.  Avoid nephrotoxic medications.  Hypokalemia.  Potassium IV and follow-up BMP.  Acne seems normal.  * lactic acidosis, due to above. Follow lactic acid.  4.  Alcohol abuse.    On CIWA alcohol, which includes thiamine replacement.  5.  Depression and anxiety.  * High-grade small bowel obstruction. NPO, NGT  suction, IVF support. Per Dr. Rosana Hoes, surgical intervention if doesn't improve was also discussed, but considering current circumstances, patient is not currently suitable for any non-emergent surgery even if were to become otherwise indicated.  All the records are reviewed and case discussed with Care Management/Social Worker. Management plans discussed with the patient, her sister POA and they are in agreement.  CODE STATUS: DNR  TOTAL TIME TAKING CARE OF THIS PATIENT: 38 minutes.   More than 50% of the time was spent in counseling/coordination of care: YES  POSSIBLE D/C IN ? DAYS, DEPENDING ON CLINICAL CONDITION.   Demetrios Loll M.D on 12-20-17 at 10:55 AM  Between 7am to 6pm - Pager - [phone]  After 6pm go to [email or web address] - [facility]

## 2017-12-31 DEATH — deceased
